# Patient Record
Sex: Female | Born: 1955 | Race: Black or African American | Hispanic: No | Marital: Married | Smoking: Never smoker
Health system: Southern US, Community
[De-identification: ages and names within clinical notes are randomized; demographics above are authoritative.]

## PROBLEM LIST (undated history)

## (undated) DIAGNOSIS — K449 Diaphragmatic hernia without obstruction or gangrene: Secondary | ICD-10-CM

## (undated) DIAGNOSIS — K219 Gastro-esophageal reflux disease without esophagitis: Secondary | ICD-10-CM

## (undated) HISTORY — DX: Gastro-esophageal reflux disease without esophagitis: K21.9

## (undated) HISTORY — DX: Diaphragmatic hernia without obstruction or gangrene: K44.9

---

## 2014-03-30 ENCOUNTER — Other Ambulatory Visit (HOSPITAL_COMMUNITY): Payer: Self-pay | Admitting: Family Medicine

## 2014-03-30 DIAGNOSIS — Z1231 Encounter for screening mammogram for malignant neoplasm of breast: Secondary | ICD-10-CM

## 2014-04-05 ENCOUNTER — Ambulatory Visit (HOSPITAL_COMMUNITY)
Admission: RE | Admit: 2014-04-05 | Discharge: 2014-04-05 | Disposition: A | Discharge status: Home / Self Care | Payer: BC Managed Care – PPO | Source: Ambulatory Visit | Attending: Family Medicine | Admitting: Family Medicine

## 2014-04-05 DIAGNOSIS — Z1231 Encounter for screening mammogram for malignant neoplasm of breast: Secondary | ICD-10-CM | POA: Insufficient documentation

## 2014-04-06 ENCOUNTER — Encounter: Payer: Self-pay | Admitting: *Deleted

## 2014-04-09 ENCOUNTER — Other Ambulatory Visit (HOSPITAL_COMMUNITY)
Admission: RE | Admit: 2014-04-09 | Discharge: 2014-04-09 | Disposition: A | Discharge status: Home / Self Care | Payer: BC Managed Care – PPO | Source: Ambulatory Visit | Attending: Obstetrics and Gynecology | Admitting: Obstetrics and Gynecology

## 2014-04-09 ENCOUNTER — Ambulatory Visit (INDEPENDENT_AMBULATORY_CARE_PROVIDER_SITE_OTHER): Payer: BC Managed Care – PPO | Admitting: Obstetrics and Gynecology

## 2014-04-09 ENCOUNTER — Encounter: Payer: Self-pay | Admitting: Obstetrics and Gynecology

## 2014-04-09 VITALS — BP 130/84 | Ht 60.0 in | Wt 200.4 lb

## 2014-04-09 DIAGNOSIS — R102 Pelvic and perineal pain: Secondary | ICD-10-CM | POA: Insufficient documentation

## 2014-04-09 DIAGNOSIS — N939 Abnormal uterine and vaginal bleeding, unspecified: Secondary | ICD-10-CM

## 2014-04-09 DIAGNOSIS — Z1212 Encounter for screening for malignant neoplasm of rectum: Secondary | ICD-10-CM

## 2014-04-09 DIAGNOSIS — Z1151 Encounter for screening for human papillomavirus (HPV): Secondary | ICD-10-CM | POA: Insufficient documentation

## 2014-04-09 DIAGNOSIS — Z01419 Encounter for gynecological examination (general) (routine) without abnormal findings: Secondary | ICD-10-CM | POA: Insufficient documentation

## 2014-04-09 DIAGNOSIS — Z113 Encounter for screening for infections with a predominantly sexual mode of transmission: Secondary | ICD-10-CM | POA: Insufficient documentation

## 2014-04-09 LAB — HEMOCCULT GUIAC POC 1CARD (OFFICE): Fecal Occult Blood, POC: NEGATIVE

## 2014-04-09 NOTE — Progress Notes (Signed)
This chart was scribed by Steva Colder, Medical Scribe, for Dr. Mallory Shirk on 04/09/14 at 9:13 AM. This chart was reviewed by Dr. Mallory Shirk for accuracy.     [facility] Visit  Patient name: Rachel Schaefer MRN [id number]  Date of birth: [birth year]  CC & HPI:  Rachel Schaefer is a 58 y.o. female presenting today for vaginal pain and vaginal spotting. She states that the spotting is not constant throughout the day. She states that she notices it 2-3 times a year and it "feels like a period". She states that the pain starts in her LLQ pain that radiates into her back. She states that the pain is improved with a bowel movement. She states that she is not having the pain as bad today. She states that she was seen at [facility] for the issue. She states that she did not hear back from [facility]. She states that she had a C-Section in the past. She states that she had fibroids in her uterus after the birth of her child. She states that she was regular in her periods.  ROS:  + Vaginal Pain + Vaginal Spotting +Constipation +History of Uterine Fibroids in years past  No other complaints.   Pertinent History Reviewed:   Reviewed: Significant for Medical         Past Medical History  Diagnosis Date  . Acid reflux   . Hernia, hiatal                               Surgical Hx:    Past Surgical History  Procedure Laterality Date  . Cesarean section     Medications: Reviewed & Updated - see associated section                      No current outpatient prescriptions on file.   Social History: Reviewed -  reports that she has never smoked. She has never used smokeless tobacco.  Objective Findings:  Vitals: Blood pressure 130/84, height 5' (1.524 m), weight 200 lb 6.4 oz (90.901 kg).   Physical Examination: General appearance - alert, well appearing, and in no distress, overweight and anxious  abd : nontender, well healed cesarean scar. Firm muscles, limiting  bimanual Pelvic - normal external genitalia, vulva, vagina, cervix, uterus and adnexa,  VULVA: normal appearing vulva with no masses, tenderness or lesions,  VAGINA: normal appearing vagina with normal color and discharge, no lesions,  CERVIX: normal appearing cervix without discharge or lesions,  UTERUS: uterus is enlarged to a 8 week size on exam limited by pt anatomy and anxiety, shape, consistency and nontender,  ADNEXA: normal adnexa in size, nontender and no masses Rectal - normal rectal, no masses     Assessment & Plan:   A:  1. Post-menopausal bleeding.  2. LLQ pain likely GI etiology.  3. H/o inadequate pap, 2015  P:  1. Pap repeated 2. Will schedule Ultrasound and endometrial biopsy.

## 2014-04-09 NOTE — Patient Instructions (Addendum)
Pap done, will follow up results and manage accordingly. Mammogram scheduled Routine preventative health maintenance measures emphasized  Follow up in 1 week, will schedule an Ultrasound and Endometrial biopsy.

## 2014-04-12 LAB — CYTOLOGY - PAP

## 2014-04-13 ENCOUNTER — Other Ambulatory Visit: Payer: Self-pay | Admitting: Obstetrics and Gynecology

## 2014-04-13 DIAGNOSIS — N950 Postmenopausal bleeding: Secondary | ICD-10-CM

## 2014-04-16 ENCOUNTER — Encounter: Payer: Self-pay | Admitting: Obstetrics and Gynecology

## 2014-04-16 ENCOUNTER — Ambulatory Visit (INDEPENDENT_AMBULATORY_CARE_PROVIDER_SITE_OTHER): Payer: BC Managed Care – PPO

## 2014-04-16 ENCOUNTER — Ambulatory Visit (INDEPENDENT_AMBULATORY_CARE_PROVIDER_SITE_OTHER): Payer: BC Managed Care – PPO | Admitting: Obstetrics and Gynecology

## 2014-04-16 ENCOUNTER — Other Ambulatory Visit: Payer: Self-pay | Admitting: Obstetrics and Gynecology

## 2014-04-16 VITALS — BP 150/98 | Ht 60.0 in | Wt 198.2 lb

## 2014-04-16 DIAGNOSIS — N840 Polyp of corpus uteri: Secondary | ICD-10-CM

## 2014-04-16 DIAGNOSIS — Z3202 Encounter for pregnancy test, result negative: Secondary | ICD-10-CM

## 2014-04-16 DIAGNOSIS — N939 Abnormal uterine and vaginal bleeding, unspecified: Secondary | ICD-10-CM

## 2014-04-16 DIAGNOSIS — N950 Postmenopausal bleeding: Secondary | ICD-10-CM

## 2014-04-16 LAB — POCT URINE PREGNANCY: PREG TEST UR: NEGATIVE

## 2014-04-16 NOTE — Patient Instructions (Signed)
Endometrial Biopsy Endometrial biopsy is a procedure in which a tissue sample is taken from inside the uterus. The tissue sample is then looked at under a microscope to see if the tissue is normal or abnormal. The endometrium is the lining of the uterus. This procedure helps determine where you are in your menstrual cycle and how hormone levels are affecting the lining of the uterus. This procedure may also be used to evaluate uterine bleeding or to diagnose endometrial cancer, tuberculosis, polyps, or inflammatory conditions.  LET YOUR HEALTH CARE PROVIDER KNOW ABOUT:  Any allergies you have.  All medicines you are taking, including vitamins, herbs, eye drops, creams, and over-the-counter medicines.  Previous problems you or members of your family have had with the use of anesthetics.  Any blood disorders you have.  Previous surgeries you have had.  Medical conditions you have.  Possibility of pregnancy. RISKS AND COMPLICATIONS Generally, this is a safe procedure. However, as with any procedure, complications can occur. Possible complications include:  Bleeding.  Pelvic infection.  Puncture of the uterine wall with the biopsy device (rare). BEFORE THE PROCEDURE   Keep a record of your menstrual cycles as directed by your health care provider. You may need to schedule your procedure for a specific time in your cycle.  You may want to bring a sanitary pad to wear home after the procedure.  Arrange for someone to drive you home after the procedure if you will be given a medicine to help you relax (sedative). PROCEDURE   You may be given a sedative to relax you.  You will lie on an exam table with your feet and legs supported as in a pelvic exam.  Your health care provider will insert an instrument (speculum) into your vagina to see your cervix.  Your cervix will be cleansed with an antiseptic solution. A medicine (local anesthetic) will be used to numb the cervix.  A forceps  instrument (tenaculum) will be used to hold your cervix steady for the biopsy.  A thin, rodlike instrument (uterine sound) will be inserted through your cervix to determine the length of your uterus and the location where the biopsy sample will be removed.  A thin, flexible tube (catheter) will be inserted through your cervix and into the uterus. The catheter is used to collect the biopsy sample from your endometrial tissue.  The catheter and speculum will then be removed, and the tissue sample will be sent to a lab for examination. AFTER THE PROCEDURE  You will rest in a recovery area until you are ready to go home.  You may have mild cramping and a small amount of vaginal bleeding for a few days after the procedure. This is normal.  Make sure you find out how to get your test results. Document Released: 01/04/2005 Document Revised: 05/06/2013 Document Reviewed: 02/18/2013 Exit[employer] Patient Information 2015 [employer]. This information is not intended to replace advice given to you by your health care provider. Make sure you discuss any questions you have with your health care provider.  

## 2014-04-16 NOTE — Progress Notes (Signed)
Patient ID: Azlin Zilberman, female   DOB: 07-10-1956, 58 y.o.   MRN: [id number]   [facility] Visit  Patient name: Nohea Kras MRN [id number]  Date of birth: 03-Jul-1956  CC & HPI:  Macy Polio is a 58 y.o. female presenting today for u/s and endo bx  ROS:    Pertinent History Reviewed:   Reviewed: Significant for see prior visit Medical         Past Medical History  Diagnosis Date  . Acid reflux   . Hernia, hiatal                               Surgical Hx:    Past Surgical History  Procedure Laterality Date  . Cesarean section     Medications: Reviewed & Updated - see associated section                      Current outpatient prescriptions:Omeprazole (PRILOSEC PO), Take by mouth daily., Disp: , Rfl:    Social History: Reviewed -  reports that she has never smoked. She has never used smokeless tobacco.  Objective Findings:  Vitals: Blood pressure 150/98, height 5' (1.524 m), weight 198 lb 3.2 oz (89.903 kg).  Rhyann Berton is a 58 y.o. G1P1001 for a pelvic sonogram for post menopausal bleeding. H/O fibroids  Uterus 8.1 x 6.2 x 4.6 cm, anteverted with multiple fibroids noted (4 in # largest 2= 3.4 & 2.8cm)  Endometrium 17.2 mm, asymmetrical, with 17 x 68mm hyperechoic mass noted within the fundal region of the cavity with +Doppler flow noted  Right ovary 2.0 x 1.6 x 1.4 cm,  Left ovary 1.4 x 1.1 x 1.1 cm,  No free fluid or adnexal masses noted wtihin the pelvis  Technician Comments:  Anteverted uterus with multiple fibroids noted, Asymmetrical endometrium with 17 x 29mm hyperechoic mass noted, bilateral ovaries appear WNL, no free fluid or adnexal masses noted within the pelvis  Lazarus Gowda  04/16/2014  9:35 AM  Endometrial Biopsy: Patient given informed consent, signed copy in the chart, time out was performed. Time out taken. The patient was placed in the lithotomy position and the cervix brought into view with sterile speculum.  Portio of cervix cleansed x 2  with betadine swabs.  A tenaculum was placed in the anterior lip of the cervix. The uterus was sounded for depth of 8 cm,. Milex uterine Explora 3 mm was introduced to into the uterus, suction created,  and an endometrial sample was obtained. All equipment was removed and accounted for.   The patient tolerated the procedure well.  Scanty tissue obtained. Difficulty of Cervical stenosis resolved by serial dilation.   Patient given post procedure instructions.  Followup: by phnone   Assessment & Plan:   A:  1. PMB  cervical stenosis   Ut fibroids   Suspected endometrial polyp   P:  1. F/u results by phone

## 2014-04-20 ENCOUNTER — Telehealth: Payer: Self-pay | Admitting: Obstetrics and Gynecology

## 2014-04-20 NOTE — Telephone Encounter (Signed)
Patient informed of the Pathology biopsy, BENIGN ENDOMETRIAL POLYP.Marland Kitchen Hysteroscopy to remove the endometrial polyp advised. I wll ask Amy Drema Dallas to contact pt to schedule hysteroscopy, Dilation and Curettage, with removal of endometrial polyp.

## 2014-05-07 ENCOUNTER — Telehealth: Payer: Self-pay | Admitting: Obstetrics and Gynecology

## 2014-05-19 ENCOUNTER — Encounter: Payer: Self-pay | Admitting: Obstetrics and Gynecology

## 2014-05-19 ENCOUNTER — Ambulatory Visit (INDEPENDENT_AMBULATORY_CARE_PROVIDER_SITE_OTHER): Payer: BC Managed Care – PPO | Admitting: Obstetrics and Gynecology

## 2014-05-19 VITALS — BP 120/86 | Ht 60.0 in | Wt 200.0 lb

## 2014-05-19 DIAGNOSIS — N840 Polyp of corpus uteri: Secondary | ICD-10-CM

## 2014-05-19 NOTE — Progress Notes (Signed)
   [facility] Visit  Patient name: Rachel Schaefer MRN [id number]  Date of birth: [birth year]  CC & HPI:  Rachel Schaefer is a 58 y.o. female presenting today for discussion and to schedule surgery. She states that she is still having the daily pain on the left side noted upon awakening and improves with activity. She states that she notices it during the course of the day, if she sits too long and then gets up. She states that she is having some pain today.  ROS:  +Endometrium Polyp No other complaints  Pertinent History Reviewed:   Reviewed: Significant for  Medical         Past Medical History  Diagnosis Date  . Acid reflux   . Hernia, hiatal                               Surgical Hx:    Past Surgical History  Procedure Laterality Date  . Cesarean section     Medications: Reviewed & Updated - see associated section                      Current outpatient prescriptions:Omeprazole (PRILOSEC PO), Take by mouth daily., Disp: , Rfl:    Social History: Reviewed -  reports that she has never smoked. She has never used smokeless tobacco.  Objective Findings:  Vitals: Blood pressure 120/86, height 5' (1.524 m), weight 200 lb (90.719 kg).  Physical Examination: Pelvic - normal external genitalia, vulva, vagina, cervix, uterus and adnexa,  VULVA: normal appearing vulva with no masses, tenderness or lesions,  VAGINA: normal appearing vagina with normal color and discharge, no lesions, good support,  CERVIX: normal appearing cervix without discharge or lesions,  UTERUS: uterus is normal size, shape, consistency and nontender,  ADNEXA: normal adnexa in size, nontender and no masses   Assessment & Plan:   A:  1. Endometrium Polyp noted. 2. Physical Exam shows to not be the cause for the chronic pain that the pt is having.   P:  1. Methods discussed about removal of Polyp or to F/U every 6 months for Review of Symptoms.   Over 25 minutes spent counseling and discussing  various methods for Endometrial polyp.   This chart was scribed by Steva Colder, Medical Scribe, for Dr. Mallory Shirk on 05/19/14 at 11:38 AM. This chart was reviewed by Dr. Mallory Shirk for accuracy.

## 2014-06-10 NOTE — Telephone Encounter (Signed)
Called patient but unable to leave a message. 

## 2014-06-22 ENCOUNTER — Telehealth: Payer: Self-pay | Admitting: *Deleted

## 2014-06-22 NOTE — Telephone Encounter (Signed)
Error.  Wasn't a nurse call.

## 2014-07-20 ENCOUNTER — Encounter: Payer: Self-pay | Admitting: Obstetrics and Gynecology

## 2014-11-15 ENCOUNTER — Encounter: Payer: Self-pay | Admitting: Obstetrics and Gynecology

## 2014-11-15 ENCOUNTER — Ambulatory Visit (INDEPENDENT_AMBULATORY_CARE_PROVIDER_SITE_OTHER): Payer: BC Managed Care – PPO | Admitting: Obstetrics and Gynecology

## 2014-11-15 VITALS — BP 120/80 | HR 80 | Ht 60.0 in | Wt 195.0 lb

## 2014-11-15 DIAGNOSIS — N840 Polyp of corpus uteri: Secondary | ICD-10-CM

## 2014-11-15 NOTE — Progress Notes (Signed)
Patient ID: Rachel Schaefer, female   DOB: 1956-04-13, 59 y.o.   MRN: [id number] Pt here today for a six month follow up. Pt states that things are the same with no change. Pt states that she has not seen any spotting at all. Pt states that she may have the pain every now and then but not as often as she was having it before.

## 2014-11-15 NOTE — Progress Notes (Addendum)
Patient ID: Rachel Schaefer, female   DOB: 05-Sep-1956, 59 y.o.   MRN: [id number]    [facility] Visit  Patient name: Rachel Schaefer MRN [id number]  Date of birth: December 03, 1955  CC & HPI:  [name] is a 59 y.o. female presenting today for 6 month follow-up of endometrial polyp. [place] in July 2015 showed asymmetrical endometrium with a 17 x 14 mm hyperechoic mass. Endometrial biopsy showed benign mass. She noted intermittent abdominal pain associated with the mass in the past, none now. Pt denies vaginal bleeding. We have discussed removing the polyp and pt agrees with removal. Will schedule for end of march.(spring Break)   ROS:  A complete 10 system review of systems was obtained and all systems are negative except as noted in the HPI and PMH.   Pertinent History Reviewed:   Reviewed. Medical         Past Medical History  Diagnosis Date   Acid reflux    Hernia, hiatal                               Surgical Hx:    Past Surgical History  Procedure Laterality Date   Cesarean section     Medications: Reviewed & Updated - see associated section                       Current outpatient prescriptions:    Omeprazole (PRILOSEC PO), Take by mouth daily., Disp: , Rfl:    Social History: Reviewed -  reports that she has never smoked. She has never used smokeless tobacco.  Objective Findings:  Vitals: Blood pressure 120/80, pulse 80, height 5' (1.524 m), weight 195 lb (88.451 kg).  Physical Examination: General appearance - alert, well appearing, and in no distress and oriented to person, place, and time Mental status - alert, oriented to person, place, and time HENT: PERRL; oropharynx clear and moist, uvula midline Pelvic - normal external genitalia, vulva, vagina, cervix, uterus and adnexa  VULVA: normal appearing vulva with no masses, tenderness or lesions VAGINA: normal appearing vagina with normal color and discharge, no lesions  CERVIX: normal appearing cervix without  discharge or lesions  UTERUS: uterus is normal size, shape, consistency and nontender  ADNEXA: normal adnexa in size, nontender and no masses   Assessment & Plan:   A:  1. Benign endometrial polyp measuring 17 x 14 mm 2. Discussed removal of polyp  P:  1.  Polypectomy in 3 weeks Day surgery [facility]    This chart was scribed for [name], MD by Tula Nakayama, ED Scribe. This patient was seen in room 1 and the patient's care was started at 10:17 AM.   I personally performed the services described in this documentation, which was SCRIBED in my presence. The recorded information has been reviewed and considered accurate. It has been edited as necessary during review. [name], MD

## 2014-12-08 ENCOUNTER — Other Ambulatory Visit: Payer: Self-pay | Admitting: Obstetrics and Gynecology

## 2014-12-08 NOTE — H&P (Signed)
  Progress Notes    Expand All Collapse All   Patient ID: Jaquanda Schaefer, female DOB: 09-02-1956, 59 y.o. MRN: [id number]   [facility] Visit  Patient name: Rachel Schaefer [id number] Date of birth: 1956/08/07  CC & HPI:  Ardyn Forge is a 59 y.o. female presenting today for 6 month follow-up of endometrial polyp. [place] in July 2015 showed asymmetrical endometrium with a 17 x 14 mm hyperechoic mass. Endometrial biopsy showed benign mass. She noted intermittent abdominal pain associated with the mass in the past, none now. Pt denies vaginal bleeding. We have discussed removing the polyp and pt agrees with removal. Will schedule for end of march.(spring Break)  ROS:  A complete 10 system review of systems was obtained and all systems are negative except as noted in the HPI and PMH.   Pertinent History Reviewed:  Reviewed. Medical  Past Medical History  Diagnosis Date  . Acid reflux   . Hernia, hiatal     Surgical Hx:  Past Surgical History  Procedure Laterality Date  . Cesarean section     Medications: Reviewed & Updated - see associated section   Current outpatient prescriptions:  . Omeprazole (PRILOSEC PO), Take by mouth daily., Disp: , Rfl:    Social History: Reviewed -  reports that she has never smoked. She has never used smokeless tobacco.  Objective Findings:  Vitals: Blood pressure 120/80, pulse 80, height 5' (1.524 m), weight 195 lb (88.451 kg).  Physical Examination: General appearance - alert, well appearing, and in no distress and oriented to person, place, and time Mental status - alert, oriented to person, place, and time HENT: PERRL; oropharynx clear and moist, uvula midline Pelvic - normal external genitalia, vulva, vagina, cervix, uterus and adnexa  VULVA: normal appearing vulva with no masses, tenderness or lesions VAGINA: normal appearing  vagina with normal color and discharge, no lesions  CERVIX: normal appearing cervix without discharge or lesions  UTERUS: uterus is normal size, shape, consistency and nontender  ADNEXA: normal adnexa in size, nontender and no masses   Assessment & Plan:   A:  1. Benign endometrial polyp measuring 17 x 14 mm 2. Discussed removal of polyp  P:  1.Hysteroscopy, Dilation and Curettage, removal of endometrial polyp, Day surgery [facility] Scheduled for 14 December 2014

## 2014-12-08 NOTE — Patient Instructions (Signed)
Rachel Schaefer  12/08/2014   Your procedure is scheduled on:   12/14/2014  Report to [facility] at  850  AM.  Call this number if you have problems the morning of surgery: [phone]   Remember:   Do not eat food or drink liquids after midnight.   Take these medicines the morning of surgery with A SIP OF WATER:  prilosec   Do not wear jewelry, make-up or nail polish.  Do not wear lotions, powders, or perfumes.   Do not shave 48 hours prior to surgery. Men may shave face and neck.  Do not bring valuables to the hospital.  [facility] is not responsible for any belongings or valuables.               Contacts, dentures or bridgework may not be worn into surgery.  Leave suitcase in the car. After surgery it may be brought to your room.  For patients admitted to the hospital, discharge time is determined by your treatment team.               Patients discharged the day of surgery will not be allowed to drive home.  Name and phone number of your driver: family  Special Instructions: Shower using CHG 2 nights before surgery and the night before surgery.  If you shower the day of surgery use CHG.  Use special wash - you have one bottle of CHG for all showers.  You should use approximately 1/3 of the bottle for each shower.   Please read over the following fact sheets that you were given: Pain Booklet, Coughing and Deep Breathing, Surgical Site Infection Prevention, Anesthesia Post-op Instructions and Care and Recovery After Surgery Hysteroscopy Hysteroscopy is a procedure used for looking inside the womb (uterus). It may be done for various reasons, including:  To evaluate abnormal bleeding, fibroid (benign, noncancerous) tumors, polyps, scar tissue (adhesions), and possibly cancer of the uterus.  To look for lumps (tumors) and other uterine growths.  To look for causes of why a woman cannot get pregnant (infertility), causes of recurrent loss of pregnancy (miscarriages), or a  lost intrauterine device (IUD).  To perform a sterilization by blocking the fallopian tubes from inside the uterus. In this procedure, a thin, flexible tube with a tiny light and camera on the end of it (hysteroscope) is used to look inside the uterus. A hysteroscopy should be done right after a menstrual period to be sure you are not pregnant. LET [facility] CARE PROVIDER KNOW ABOUT:   Any allergies you have.  All medicines you are taking, including vitamins, herbs, eye drops, creams, and over-the-counter medicines.  Previous problems you or members of your family have had with the use of anesthetics.  Any blood disorders you have.  Previous surgeries you have had.  Medical conditions you have. RISKS AND COMPLICATIONS  Generally, this is a safe procedure. However, as with any procedure, complications can occur. Possible complications include:  Putting a hole in the uterus.  Excessive bleeding.  Infection.  Damage to the cervix.  Injury to other organs.  Allergic reaction to medicines.  Too much fluid used in the uterus for the procedure. BEFORE THE PROCEDURE   Ask your health care provider about changing or stopping any regular medicines.  Do not take aspirin or blood thinners for 1 week before the procedure, or as directed by your health care provider. These can cause bleeding.  If you smoke, do  not smoke for 2 weeks before the procedure.  In some cases, a medicine is placed in the cervix the day before the procedure. This medicine makes the cervix have a larger opening (dilate). This makes it easier for the instrument to be inserted into the uterus during the procedure.  Do not eat or drink anything for at least 8 hours before the surgery.  Arrange for someone to take you home after the procedure. PROCEDURE   You may be given a medicine to relax you (sedative). You may also be given one of the following:  A medicine that numbs the area around the cervix (local  anesthetic).  A medicine that makes you sleep through the procedure (general anesthetic).  The hysteroscope is inserted through the vagina into the uterus. The camera on the hysteroscope sends a picture to a TV screen. This gives the surgeon a good view inside the uterus.  During the procedure, air or a liquid is put into the uterus, which allows the surgeon to see better.  Sometimes, tissue is gently scraped from inside the uterus. These tissue samples are sent to a lab for testing. AFTER THE PROCEDURE   If you had a general anesthetic, you may be groggy for a couple hours after the procedure.  If you had a local anesthetic, you will be able to go home as soon as you are stable and feel ready.  You may have some cramping. This normally lasts for a couple days.  You may have bleeding, which varies from light spotting for a few days to menstrual-like bleeding for 3-7 days. This is normal.  If your test results are not back during the visit, make an appointment with your health care provider to find out the results. Document Released: 12/10/2000 Document Revised: 06/24/2013 Document Reviewed: 04/02/2013 [facility] Patient Information 2015 [place], [place]. This information is not intended to replace advice given to you by your health care provider. Make sure you discuss any questions you have with your health care provider. Dilation and Curettage or Vacuum Curettage Dilation and curettage (D&C) and vacuum curettage are minor procedures. A D&C involves stretching (dilation) the cervix and scraping (curettage) the inside lining of the womb (uterus). During a D&C, tissue is gently scraped from the inside lining of the uterus. During a vacuum curettage, the lining and tissue in the uterus are removed with the use of gentle suction.  Curettage may be performed to either diagnose or treat a problem. As a diagnostic procedure, curettage is performed to examine tissues from the uterus. A diagnostic  curettage may be performed for the following symptoms:   Irregular bleeding in the uterus.   Bleeding with the development of clots.   Spotting between menstrual periods.   Prolonged menstrual periods.   Bleeding after menopause.   No menstrual period (amenorrhea).   A change in size and shape of the uterus.  As a treatment procedure, curettage may be performed for the following reasons:   Removal of an IUD (intrauterine device).   Removal of retained placenta after giving birth. Retained placenta can cause an infection or bleeding severe enough to require transfusions.   Abortion.   Miscarriage.   Removal of polyps inside the uterus.   Removal of uncommon types of noncancerous lumps (fibroids).  LET [facility] CARE PROVIDER KNOW ABOUT:   Any allergies you have.   All medicines you are taking, including vitamins, herbs, eye drops, creams, and over-the-counter medicines.   Previous problems you or members of  your family have had with the use of anesthetics.   Any blood disorders you have.   Previous surgeries you have had.   Medical conditions you have. RISKS AND COMPLICATIONS  Generally, this is a safe procedure. However, as with any procedure, complications can occur. Possible complications include:  Excessive bleeding.   Infection of the uterus.   Damage to the cervix.   Development of scar tissue (adhesions) inside the uterus, later causing abnormal amounts of menstrual bleeding.   Complications from the general anesthetic, if a general anesthetic is used.   Putting a hole (perforation) in the uterus. This is rare.  BEFORE THE PROCEDURE   Eat and drink before the procedure only as directed by your health care provider.   Arrange for someone to take you home.  PROCEDURE  This procedure usually takes about 15-30 minutes.  You will be given one of the following:  A medicine that numbs the area in and around the cervix (local  anesthetic).   A medicine to make you sleep through the procedure (general anesthetic).  You will lie on your back with your legs in stirrups.   A warm metal or plastic instrument (speculum) will be placed in your vagina to keep it open and to allow the health care provider to see the cervix.  There are two ways in which your cervix can be softened and dilated. These include:   Taking a medicine.   Having thin rods (laminaria) inserted into your cervix.   A curved tool (curette) will be used to scrape cells from the inside lining of the uterus. In some cases, gentle suction is applied with the curette. The curette will then be removed.  AFTER THE PROCEDURE   You will rest in the recovery area until you are stable and are ready to go home.   You may feel sick to your stomach (nauseous) or throw up (vomit) if you were given a general anesthetic.   You may have a sore throat if a tube was placed in your throat during general anesthesia.   You may have light cramping and bleeding. This may last for 2 days to 2 weeks after the procedure.   Your uterus needs to make a new lining after the procedure. This may make your next period late. Document Released: 09/03/2005 Document Revised: 05/06/2013 Document Reviewed: 04/02/2013 [facility] Patient Information 2015 [place], [place]. This information is not intended to replace advice given to you by your health care provider. Make sure you discuss any questions you have with your health care provider. PATIENT INSTRUCTIONS POST-ANESTHESIA  IMMEDIATELY FOLLOWING SURGERY:  Do not drive or operate machinery for the first twenty four hours after surgery.  Do not make any important decisions for twenty four hours after surgery or while taking narcotic pain medications or sedatives.  If you develop intractable nausea and vomiting or a severe headache please notify your doctor immediately.  FOLLOW-UP:  Please make an appointment with your surgeon  as instructed. You do not need to follow up with anesthesia unless specifically instructed to do so.  WOUND CARE INSTRUCTIONS (if applicable):  Keep a dry clean dressing on the anesthesia/puncture wound site if there is drainage.  Once the wound has quit draining you may leave it open to air.  Generally you should leave the bandage intact for twenty four hours unless there is drainage.  If the epidural site drains for more than 36-48 hours please call the anesthesia department.  QUESTIONS?:  Please feel free  to call your physician or the hospital operator if you have any questions, and they will be happy to assist you.

## 2014-12-13 ENCOUNTER — Encounter (HOSPITAL_COMMUNITY)
Admission: RE | Admit: 2014-12-13 | Discharge: 2014-12-13 | Disposition: A | Discharge status: Home / Self Care | Payer: BC Managed Care – PPO | Source: Ambulatory Visit | Attending: Obstetrics and Gynecology | Admitting: Obstetrics and Gynecology

## 2014-12-13 ENCOUNTER — Other Ambulatory Visit: Payer: Self-pay

## 2014-12-13 ENCOUNTER — Encounter (HOSPITAL_COMMUNITY): Payer: Self-pay

## 2014-12-13 DIAGNOSIS — K219 Gastro-esophageal reflux disease without esophagitis: Secondary | ICD-10-CM | POA: Diagnosis not present

## 2014-12-13 DIAGNOSIS — N882 Stricture and stenosis of cervix uteri: Secondary | ICD-10-CM | POA: Diagnosis not present

## 2014-12-13 DIAGNOSIS — D259 Leiomyoma of uterus, unspecified: Secondary | ICD-10-CM | POA: Diagnosis not present

## 2014-12-13 DIAGNOSIS — N950 Postmenopausal bleeding: Secondary | ICD-10-CM | POA: Diagnosis not present

## 2014-12-13 DIAGNOSIS — N840 Polyp of corpus uteri: Secondary | ICD-10-CM | POA: Diagnosis not present

## 2014-12-13 LAB — URINALYSIS, ROUTINE W REFLEX MICROSCOPIC
Bilirubin Urine: NEGATIVE
GLUCOSE, UA: NEGATIVE mg/dL
Ketones, ur: NEGATIVE mg/dL
LEUKOCYTES UA: NEGATIVE
Nitrite: NEGATIVE
PH: 6 (ref 5.0–8.0)
Protein, ur: NEGATIVE mg/dL
Specific Gravity, Urine: 1.015 (ref 1.005–1.030)
Urobilinogen, UA: 0.2 mg/dL (ref 0.0–1.0)

## 2014-12-13 LAB — BASIC METABOLIC PANEL
ANION GAP: 9 (ref 5–15)
BUN: 8 mg/dL (ref 6–23)
CHLORIDE: 107 mmol/L (ref 96–112)
CO2: 25 mmol/L (ref 19–32)
Calcium: 9.1 mg/dL (ref 8.4–10.5)
Creatinine, Ser: 1.1 mg/dL (ref 0.50–1.10)
GFR, EST AFRICAN AMERICAN: 62 mL/min — AB (ref >90)
GFR, EST NON AFRICAN AMERICAN: 54 mL/min — AB (ref >90)
Glucose, Bld: 80 mg/dL (ref 70–99)
POTASSIUM: 4.2 mmol/L (ref 3.5–5.1)
SODIUM: 141 mmol/L (ref 135–145)

## 2014-12-13 LAB — URINE MICROSCOPIC-ADD ON

## 2014-12-13 LAB — CBC
HEMATOCRIT: 43.6 % (ref 36.0–46.0)
HEMOGLOBIN: 13.8 g/dL (ref 12.0–15.0)
MCH: 26.6 pg (ref 26.0–34.0)
MCHC: 31.7 g/dL (ref 30.0–36.0)
MCV: 84.2 fL (ref 78.0–100.0)
Platelets: 246 10*3/uL (ref 150–400)
RBC: 5.18 MIL/uL — ABNORMAL HIGH (ref 3.87–5.11)
RDW: 15 % (ref 11.5–15.5)
WBC: 4.5 10*3/uL (ref 4.0–10.5)

## 2014-12-13 NOTE — H&P (Signed)
    Progress Notes    Expand All Collapse All   Patient ID: Rachel Schaefer, female DOB: 16-Jun-1956, 59 y.o. MRN: [id number]  [place] Visit  Patient name: Rachel Schaefer [id number] Date of birth: 05/19/1956  CC & HPI:  Rachel Schaefer is a 59 y.o. female presenting today for u/s and endo bx  ROS:    Pertinent History Reviewed:  Reviewed: Significant for see prior visit Medical  Past Medical History  Diagnosis Date  . Acid reflux   . Hernia, hiatal     Surgical Hx:  Past Surgical History  Procedure Laterality Date  . Cesarean section     Medications: Reviewed & Updated - see associated section  Current outpatient prescriptions:Omeprazole (PRILOSEC PO), Take by mouth daily., Disp: , Rfl:    Social History: Reviewed -  reports that she has never smoked. She has never used smokeless tobacco.  Objective Findings:  Vitals: Blood pressure 150/98, height 5' (1.524 m), weight 198 lb 3.2 oz (89.903 kg).  Rachel Schaefer is a 59 y.o. G1P1001 for a pelvic sonogram for post menopausal bleeding. H/O fibroids  Uterus 8.1 x 6.2 x 4.6 cm, anteverted with multiple fibroids noted (4 in # largest 2= 3.4 & 2.8cm)  Endometrium 17.2 mm, asymmetrical, with 17 x 6mm hyperechoic mass noted within the fundal region of the cavity with +Doppler flow noted  Right ovary 2.0 x 1.6 x 1.4 cm,  Left ovary 1.4 x 1.1 x 1.1 cm,  No free fluid or adnexal masses noted wtihin the pelvis  Technician Comments:  Anteverted uterus with multiple fibroids noted, Asymmetrical endometrium with 17 x 1mm hyperechoic mass noted, bilateral ovaries appear WNL, no free fluid or adnexal masses noted within the pelvis  Lazarus Gowda  04/16/2014  9:35 AM  Endometrial Biopsy: Patient given informed consent, signed copy in the chart, time out was performed. Time out taken. The patient was placed in the  lithotomy position and the cervix brought into view with sterile speculum. Portio of cervix cleansed x 2 with betadine swabs. A tenaculum was placed in the anterior lip of the cervix. The uterus was sounded for depth of 8 cm,. Milex uterine Explora 3 mm was introduced to into the uterus, suction created, and an endometrial sample was obtained. All equipment was removed and accounted for.  The patient tolerated the procedure well. Scanty tissue obtained. Difficulty of Cervical stenosis resolved by serial dilation.  Patient given post procedure instructions.  Followup: by phnone   Assessment & Plan:   A:  1. PMB cervical stenosis  Ut fibroids  Suspected endometrial polyp  Plan: hysteroscopy dilation and curettage, removal of endometrial polyp

## 2014-12-14 ENCOUNTER — Encounter (HOSPITAL_COMMUNITY): Payer: Self-pay | Admitting: *Deleted

## 2014-12-14 ENCOUNTER — Ambulatory Visit (HOSPITAL_COMMUNITY)
Admission: RE | Admit: 2014-12-14 | Discharge: 2014-12-14 | Disposition: A | Discharge status: Home / Self Care | Payer: BC Managed Care – PPO | Source: Ambulatory Visit | Attending: Obstetrics and Gynecology | Admitting: Obstetrics and Gynecology

## 2014-12-14 ENCOUNTER — Ambulatory Visit (HOSPITAL_COMMUNITY): Payer: BC Managed Care – PPO | Admitting: Anesthesiology

## 2014-12-14 ENCOUNTER — Encounter (HOSPITAL_COMMUNITY)
Admission: RE | Disposition: A | Discharge status: Home / Self Care | Payer: Self-pay | Source: Ambulatory Visit | Attending: Obstetrics and Gynecology

## 2014-12-14 DIAGNOSIS — D259 Leiomyoma of uterus, unspecified: Secondary | ICD-10-CM | POA: Insufficient documentation

## 2014-12-14 DIAGNOSIS — N882 Stricture and stenosis of cervix uteri: Secondary | ICD-10-CM | POA: Insufficient documentation

## 2014-12-14 DIAGNOSIS — K219 Gastro-esophageal reflux disease without esophagitis: Secondary | ICD-10-CM | POA: Insufficient documentation

## 2014-12-14 DIAGNOSIS — N840 Polyp of corpus uteri: Secondary | ICD-10-CM | POA: Diagnosis not present

## 2014-12-14 DIAGNOSIS — N950 Postmenopausal bleeding: Secondary | ICD-10-CM | POA: Insufficient documentation

## 2014-12-14 HISTORY — PX: POLYPECTOMY: SHX5525

## 2014-12-14 HISTORY — PX: HYSTEROSCOPY WITH D & C: SHX1775

## 2014-12-14 SURGERY — DILATATION AND CURETTAGE /HYSTEROSCOPY
Anesthesia: General

## 2014-12-14 MED ORDER — FENTANYL CITRATE 0.05 MG/ML IJ SOLN: 25.0000 ug | INTRAMUSCULAR | Status: DC | PRN

## 2014-12-14 MED ORDER — NEOSTIGMINE METHYLSULFATE 10 MG/10ML IV SOLN
INTRAVENOUS | Status: DC | PRN
  Administered 2014-12-14: 1 mg via INTRAVENOUS
  Administered 2014-12-14: 2 mg via INTRAVENOUS

## 2014-12-14 MED ORDER — ONDANSETRON HCL 4 MG/2ML IJ SOLN
4.0000 mg | Freq: Once | INTRAMUSCULAR | Status: AC
  Administered 2014-12-14: 4 mg via INTRAVENOUS

## 2014-12-14 MED ORDER — PROPOFOL 10 MG/ML IV BOLUS
INTRAVENOUS | Status: DC | PRN
  Administered 2014-12-14: 140 mg via INTRAVENOUS

## 2014-12-14 MED ORDER — LIDOCAINE HCL (PF) 1 % IJ SOLN
INTRAMUSCULAR | Status: AC
  Filled 2014-12-14: qty 5

## 2014-12-14 MED ORDER — SODIUM CHLORIDE 0.9 % IR SOLN
Status: DC | PRN
  Administered 2014-12-14: 1000 mL

## 2014-12-14 MED ORDER — LIDOCAINE HCL 1 % IJ SOLN
INTRAMUSCULAR | Status: DC | PRN
  Administered 2014-12-14: 25 mg via INTRADERMAL

## 2014-12-14 MED ORDER — GLYCOPYRROLATE 0.2 MG/ML IJ SOLN
INTRAMUSCULAR | Status: DC | PRN
  Administered 2014-12-14: 0.4 mg via INTRAVENOUS

## 2014-12-14 MED ORDER — MIDAZOLAM HCL 2 MG/2ML IJ SOLN
INTRAMUSCULAR | Status: AC
  Filled 2014-12-14: qty 2

## 2014-12-14 MED ORDER — GLYCOPYRROLATE 0.2 MG/ML IJ SOLN
INTRAMUSCULAR | Status: AC
  Filled 2014-12-14: qty 2

## 2014-12-14 MED ORDER — ROCURONIUM BROMIDE 50 MG/5ML IV SOLN
INTRAVENOUS | Status: AC
  Filled 2014-12-14: qty 1

## 2014-12-14 MED ORDER — ONDANSETRON HCL 4 MG/2ML IJ SOLN
INTRAMUSCULAR | Status: AC
  Filled 2014-12-14: qty 2

## 2014-12-14 MED ORDER — ROCURONIUM BROMIDE 100 MG/10ML IV SOLN
INTRAVENOUS | Status: DC | PRN
  Administered 2014-12-14: 35 mg via INTRAVENOUS

## 2014-12-14 MED ORDER — MIDAZOLAM HCL 2 MG/2ML IJ SOLN
1.0000 mg | INTRAMUSCULAR | Status: DC | PRN
  Administered 2014-12-14: 2 mg via INTRAVENOUS

## 2014-12-14 MED ORDER — MIDAZOLAM HCL 5 MG/5ML IJ SOLN
INTRAMUSCULAR | Status: DC | PRN
  Administered 2014-12-14: 2 mg via INTRAVENOUS

## 2014-12-14 MED ORDER — ONDANSETRON HCL 4 MG/2ML IJ SOLN: 4.0000 mg | Freq: Once | INTRAMUSCULAR | Status: DC | PRN

## 2014-12-14 MED ORDER — FENTANYL CITRATE 0.05 MG/ML IJ SOLN
INTRAMUSCULAR | Status: DC | PRN
Start: 2014-12-14 — End: 2014-12-14
  Administered 2014-12-14: 25 ug via INTRAVENOUS
  Administered 2014-12-14: 50 ug via INTRAVENOUS
  Administered 2014-12-14: 25 ug via INTRAVENOUS

## 2014-12-14 MED ORDER — FERRIC SUBSULFATE 259 MG/GM EX SOLN
CUTANEOUS | Status: AC
  Filled 2014-12-14: qty 8

## 2014-12-14 MED ORDER — LACTATED RINGERS IV SOLN
INTRAVENOUS | Status: DC
  Administered 2014-12-14: 10:00:00 via INTRAVENOUS

## 2014-12-14 MED ORDER — NEOSTIGMINE METHYLSULFATE 10 MG/10ML IV SOLN
INTRAVENOUS | Status: AC
  Filled 2014-12-14: qty 2

## 2014-12-14 MED ORDER — FENTANYL CITRATE 0.05 MG/ML IJ SOLN
INTRAMUSCULAR | Status: AC
  Filled 2014-12-14: qty 2

## 2014-12-14 MED ORDER — PROPOFOL 10 MG/ML IV BOLUS
INTRAVENOUS | Status: AC
  Filled 2014-12-14: qty 20

## 2014-12-14 MED ORDER — SUCCINYLCHOLINE CHLORIDE 20 MG/ML IJ SOLN
INTRAMUSCULAR | Status: AC
  Filled 2014-12-14: qty 1

## 2014-12-14 MED ORDER — KETOROLAC TROMETHAMINE 10 MG PO TABS: 10.0000 mg | ORAL_TABLET | Freq: Four times a day (QID) | ORAL | Status: DC | PRN

## 2014-12-14 MED ORDER — CEFAZOLIN SODIUM-DEXTROSE 2-3 GM-% IV SOLR
2.0000 g | INTRAVENOUS | Status: AC
  Administered 2014-12-14: 2 g via INTRAVENOUS
  Filled 2014-12-14: qty 50

## 2014-12-14 SURGICAL SUPPLY — 32 items
BAG HAMPER (MISCELLANEOUS) ×3 IMPLANT
CLOTH BEACON ORANGE TIMEOUT ST (SAFETY) ×3 IMPLANT
COVER LIGHT HANDLE STERIS (MISCELLANEOUS) ×6 IMPLANT
DECANTER SPIKE VIAL GLASS SM (MISCELLANEOUS) ×3 IMPLANT
DRAPE PROXIMA HALF (DRAPES) ×3 IMPLANT
DRAPE STERI URO 9X17 APER PCH (DRAPES) ×3 IMPLANT
ELECT REM PT RETURN 9FT ADLT (ELECTROSURGICAL) ×3
ELECTRODE REM PT RTRN 9FT ADLT (ELECTROSURGICAL) ×1 IMPLANT
FORMALIN 10 PREFIL 120ML (MISCELLANEOUS) ×3 IMPLANT
FORMALIN 10 PREFIL 480ML (MISCELLANEOUS) ×3 IMPLANT
GAUZE PACKING 2X5 YD STRL (GAUZE/BANDAGES/DRESSINGS) IMPLANT
GLOVE BIOGEL PI IND STRL 9 (GLOVE) ×1 IMPLANT
GLOVE BIOGEL PI INDICATOR 9 (GLOVE) ×2
GLOVE ECLIPSE 9.0 STRL (GLOVE) ×3 IMPLANT
GOWN SPEC L3 XXLG W/TWL (GOWN DISPOSABLE) ×6 IMPLANT
GOWN STRL REUS W/TWL LRG LVL3 (GOWN DISPOSABLE) ×3 IMPLANT
INST SET HYSTEROSCOPY (KITS) ×3 IMPLANT
IV NS 1000ML (IV SOLUTION) ×2
IV NS 1000ML BAXH (IV SOLUTION) ×1 IMPLANT
KIT ROOM TURNOVER APOR (KITS) ×3 IMPLANT
MANIFOLD NEPTUNE II (INSTRUMENTS) ×3 IMPLANT
NS IRRIG 1000ML POUR BTL (IV SOLUTION) ×3 IMPLANT
PACK PERI GYN (CUSTOM PROCEDURE TRAY) ×3 IMPLANT
PAD ARMBOARD 7.5X6 YLW CONV (MISCELLANEOUS) ×3 IMPLANT
PAD TELFA 3X4 1S STER (GAUZE/BANDAGES/DRESSINGS) ×3 IMPLANT
SET BASIN LINEN APH (SET/KITS/TRAYS/PACK) ×3 IMPLANT
SET CYSTO W/LG BORE CLAMP LF (SET/KITS/TRAYS/PACK) ×3 IMPLANT
SET IV ADMIN VERSALIGHT (MISCELLANEOUS) IMPLANT
SUT CHROMIC 2 0 CT 1 (SUTURE) ×3 IMPLANT
SUT PROLENE 2 0 FS (SUTURE) IMPLANT
SUT VIC AB 0 CT2 8-18 (SUTURE) IMPLANT
VERSALIGHT (MISCELLANEOUS) IMPLANT

## 2014-12-14 NOTE — Op Note (Signed)
Please see the brief operative note for operative details 

## 2014-12-14 NOTE — Anesthesia Procedure Notes (Signed)
Procedure Name: Intubation Date/Time: 12/14/2014 10:09 AM Performed by: Charmaine Downs Pre-anesthesia Checklist: Patient being monitored, Suction available, Emergency Drugs available and Patient identified Patient Re-evaluated:Patient Re-evaluated prior to inductionOxygen Delivery Method: Circle system utilized Preoxygenation: Pre-oxygenation with 100% oxygen Intubation Type: IV induction and Cricoid Pressure applied Ventilation: Mask ventilation without difficulty and Oral airway inserted - appropriate to patient size Laryngoscope Size: Mac and 3 Grade View: Grade II Tube type: Oral Tube size: 7.0 mm Number of attempts: 1 Airway Equipment and Method: Stylet and Oral airway Placement Confirmation: positive ETCO2,  ETT inserted through vocal cords under direct vision and breath sounds checked- equal and bilateral Secured at: 22 cm Tube secured with: Tape Dental Injury: Teeth and Oropharynx as per pre-operative assessment

## 2014-12-14 NOTE — Interval H&P Note (Signed)
History and Physical Interval Note:  12/14/2014 9:53 AM  Rachel Schaefer  has presented today for surgery, with the diagnosis of endometrial polyp  The various methods of treatment have been discussed with the patient and family. After consideration of risks, benefits and other options for treatment, the patient has consented to  Procedure(s): DILATATION AND CURETTAGE /HYSTEROSCOPY (N/A) POLYPECTOMY (N/A) as a surgical intervention .  The patient's history has been reviewed, patient examined, no change in status, stable for surgery.  I have reviewed the patient's chart and labs.  Questions were answered to the patient's satisfaction.  The patient's husband is with her at this time    [name]

## 2014-12-14 NOTE — Progress Notes (Signed)
Sero-sanguinous fluid about 50 cent piece on peripad.

## 2014-12-14 NOTE — Anesthesia Preprocedure Evaluation (Signed)
Anesthesia Evaluation  Patient identified by MRN, date of birth, ID band Patient awake    Reviewed: Allergy & Precautions, NPO status , Patient's Chart, lab work & pertinent test results  Airway Mallampati: II  TM Distance: >3 FB     Dental  (+) Teeth Intact   Pulmonary neg pulmonary ROS,  breath sounds clear to auscultation        Cardiovascular negative cardio ROS  Rhythm:Regular Rate:Normal     Neuro/Psych    GI/Hepatic hiatal hernia, GERD-  Medicated and Poorly Controlled,  Endo/Other    Renal/GU      Musculoskeletal   Abdominal   Peds  Hematology   Anesthesia Other Findings   Reproductive/Obstetrics                             Anesthesia Physical Anesthesia Plan  ASA: II  Anesthesia Plan: General   Post-op Pain Management:    Induction: Intravenous, Rapid sequence and Cricoid pressure planned  Airway Management Planned: Oral ETT  Additional Equipment:   Intra-op Plan:   Post-operative Plan: Extubation in OR  Informed Consent: I have reviewed the patients History and Physical, chart, labs and discussed the procedure including the risks, benefits and alternatives for the proposed anesthesia with the patient or authorized representative who has indicated his/her understanding and acceptance.     Plan Discussed with:   Anesthesia Plan Comments:         Anesthesia Quick Evaluation

## 2014-12-14 NOTE — Discharge Instructions (Signed)
Dilation and Curettage or Vacuum Curettage, Care After  Refer to this sheet in the next few weeks. These instructions provide you with information on caring for yourself after your procedure. Your health care provider may also give you more specific instructions. Your treatment has been planned according to current medical practices, but problems sometimes occur. Call your health care provider if you have any problems or questions after your procedure.  WHAT TO EXPECT AFTER THE PROCEDURE  After your procedure, it is typical to have light cramping and bleeding. This may last for 2 days to 2 weeks after the procedure.  HOME CARE INSTRUCTIONS   · Do not drive for 24 hours.  · Wait 1 week before returning to strenuous activities.  · Take your temperature 2 times a day for 4 days and write it down. Provide these temperatures to your health care provider if you develop a fever.  · Avoid long periods of standing.  · Avoid heavy lifting, pushing, or pulling. Do not lift anything heavier than 10 pounds (4.5 kg).  · Limit stair climbing to once or twice a day.  · Take rest periods often.  · You may resume your usual diet.  · Drink enough fluids to keep your urine clear or pale yellow.  · Your usual bowel function should return. If you have constipation, you may:  ¨ Take a mild laxative with permission from your health care provider.  ¨ Add fruit and bran to your diet.  ¨ Drink more fluids.  · Take showers instead of baths until your health care provider gives you permission to take baths.  · Do not go swimming or use a hot tub until your health care provider approves.  · Try to have someone with you or available to you the first 24-48 hours, especially if you were given a general anesthetic.  · Do not douche, use tampons, or have sex (intercourse) for 2 weeks after the procedure.  · Only take over-the-counter or prescription medicines as directed by your health care provider. Do not take aspirin. It can cause  bleeding.  · Follow up with your health care provider as directed.  SEEK MEDICAL CARE IF:   · You have increasing cramps or pain that is not relieved with medicine.  · You have abdominal pain that does not seem to be related to the same area of earlier cramping and pain.  · You have bad smelling vaginal discharge.  · You have a rash.  · You are having problems with any medicine.  SEEK IMMEDIATE MEDICAL CARE IF:   · You have bleeding that is heavier than a normal menstrual period.  · You have a fever.  · You have chest pain.  · You have shortness of breath.  · You feel dizzy or feel like fainting.  · You pass out.  · You have pain in your shoulder strap area.  · You have heavy vaginal bleeding with or without blood clots.  MAKE SURE YOU:   · Understand these instructions.  · Will watch your condition.  · Will get help right away if you are not doing well or get worse.  Document Released: 08/31/2000 Document Revised: 09/08/2013 Document Reviewed: 04/02/2013  [employer]® Patient Information ©2015 [employer]. This information is not intended to replace advice given to you by your health care provider. Make sure you discuss any questions you have with your health care provider.

## 2014-12-14 NOTE — Transfer of Care (Signed)
Immediate Anesthesia Transfer of Care Note  Patient: Rachel Schaefer  Procedure(s) Performed: Procedure(s): DILATATION AND CURETTAGE /HYSTEROSCOPY (N/A) POLYPECTOMY (N/A)  Patient Location: PACU  Anesthesia Type:General  Level of Consciousness: awake and patient cooperative  Airway & Oxygen Therapy: Patient Spontanous Breathing and Patient connected to face mask oxygen  Post-op Assessment: Report given to RN, Post -op Vital signs reviewed and stable and Patient moving all extremities  Post vital signs: Reviewed and stable  Last Vitals:  Filed Vitals:   12/14/14 0955  BP: 106/69  Pulse:   Temp:   Resp: 29    Complications: No apparent anesthesia complications

## 2014-12-14 NOTE — Brief Op Note (Signed)
12/14/2014  11:23 AM  PATIENT:  Rachel Schaefer  59 y.o. female  PRE-OPERATIVE DIAGNOSIS:  endometrial polyp .  POST-OPERATIVE DIAGNOSIS:  endometrial polyp, cervical stenosis,postmenopausel bleeding  PROCEDURE:  Procedure(s): DILATATION AND CURETTAGE /HYSTEROSCOPY (N/A) POLYPECTOMY (N/A)  SURGEON:  Surgeon(s) and Role:    * [name], MD - Primary  PHYSICIAN ASSISTANT:   ASSISTANTS: none   ANESTHESIA:   general  EBL:  Total I/O In: 700 [I.V.:700] Out: -   BLOOD ADMINISTERED:none  DRAINS: none   LOCAL MEDICATIONS USED:  NONE  SPECIMEN:  Source of Specimen:  endometrial polyps  DISPOSITION OF SPECIMEN:  PATHOLOGY  COUNTS:  YES  TOURNIQUET:  * No tourniquets in log *  DICTATION: .Dragon Dictation  PLAN OF CARE: Discharge to home after PACU  PATIENT DISPOSITION:  PACU - hemodynamically stable.   Delay start of Pharmacological VTE agent (>24hrs) due to surgical blood loss or risk of bleeding: not applicable Details of procedure: Patient was taken operating room prepped and draped for vaginal procedure with timeout conducted. Cervix was grasped with single-tooth tenaculum on the anterior and posterior lip due to the stenosis, and with care the uterine sound could be passed through the stenotic endocervix identifying uterine cavity of 7 cm length. Cervix and lower uterine segment were dilated to 25 [place] allowing introduction of the 30 operative hysteroscope which identified multiple small polyps and 2 large 1 cm  polyps hysteroscopic scissors were then used to tape the smaller polyps off at their base rectum visually guided. The larger polyps Heather base at the apex of the uterine cavity and could only be partially resected with the hysteroscope.  Stone forceps could then be used to grasp the polyp tissue and extracted. Hysteroscopy was repeated as needed throughout the case to confirm that proper positioning of the Randall stone forceps remained. Upon completion of  the procedure the endometrial cavity somewhat obscured by blood but there was no suspicion of perforation. The ostia could be easily visualized. The polyp originated Near the left tubal ostia. Satisfactory evacuation of the uterus was completed, and the cervix identified as having a slightly enlarged opening where the tenaculum had been holding the cervix on the anterior lip and a single stitch of 2-0 chromic was placed across the small opening from the tenaculum with good hemostasis resulting. EBL 50 cc condition to recovery in good

## 2014-12-14 NOTE — Anesthesia Postprocedure Evaluation (Signed)
  Anesthesia Post-op Note  Patient: Rachel Schaefer  Procedure(s) Performed: Procedure(s): DILATATION AND CURETTAGE /HYSTEROSCOPY (N/A) POLYPECTOMY (N/A)  Patient Location: PACU  Anesthesia Type:General  Level of Consciousness: awake, alert , oriented and patient cooperative  Airway and Oxygen Therapy: Patient Spontanous Breathing  Post-op Pain: 2 /10, mild  Post-op Assessment: Post-op Vital signs reviewed, Patient's Cardiovascular Status Stable, Respiratory Function Stable, Patent Airway, No signs of Nausea or vomiting and Pain level controlled  Post-op Vital Signs: Reviewed and stable  Last Vitals:  Filed Vitals:   12/14/14 0955  BP: 106/69  Pulse:   Temp:   Resp: 29    Complications: No apparent anesthesia complications

## 2014-12-15 ENCOUNTER — Telehealth: Payer: Self-pay | Admitting: Obstetrics and Gynecology

## 2014-12-15 ENCOUNTER — Encounter (HOSPITAL_COMMUNITY): Payer: Self-pay | Admitting: Obstetrics and Gynecology

## 2014-12-15 NOTE — Telephone Encounter (Signed)
Benign tissue, pt informed. Pt doing well

## 2014-12-21 ENCOUNTER — Encounter: Payer: Self-pay | Admitting: Obstetrics and Gynecology

## 2014-12-21 ENCOUNTER — Ambulatory Visit (INDEPENDENT_AMBULATORY_CARE_PROVIDER_SITE_OTHER): Payer: BC Managed Care – PPO | Admitting: Obstetrics and Gynecology

## 2014-12-21 VITALS — BP 124/80 | Ht 60.0 in | Wt 194.0 lb

## 2014-12-21 DIAGNOSIS — Z9889 Other specified postprocedural states: Secondary | ICD-10-CM

## 2014-12-21 NOTE — Progress Notes (Signed)
Patient ID: Rachel Schaefer, female   DOB: 1956/07/11, 59 y.o.   MRN: [id number] Pt here today for post op visit. Pt states that she is still having a very light bleed. Pt denies any problems or concerns at this time.      Subjective:  Rachel Schaefer is a 59 y.o. female now 1 weeks status post operative hysteroscopy.    polyps found and removed Review of Systems Negative except light spotting, no postop pain r diet:   regular   Bowel movements : normal.  The patient is not having any pain.  Objective:  BP 124/80 mmHg  Ht 5' (1.524 m)  Wt 194 lb (87.998 kg)  BMI 37.89 kg/m2  LMP 12/14/2014 General:Well developed, well nourished.  No acute distress. Abdomen: Bowel sounds normal, soft, non-tender. Pelvic Exam:    External Genitalia:  Normal.    Vagina: Normal    Cervix: healing s/p stitch    Uterus: nontender    Adnexa/Bimanual: Normal  Incision(s):   Healing cervix, no drainage, no erythema, no hernia, no swelling, no dehiscence,     Assessment:  Post-Op 1 weeks s/p operative hysteroscopy   amd removal of polyp Doing well postoperatively.   Plan:  1.Wound care discussed   2. . current medications.n/a 3. Activity restrictions: none 4. return to work: not applicable. 5. Follow up in 1 yr.

## 2015-03-11 ENCOUNTER — Other Ambulatory Visit: Payer: Self-pay | Admitting: Obstetrics and Gynecology

## 2015-03-11 DIAGNOSIS — Z1231 Encounter for screening mammogram for malignant neoplasm of breast: Secondary | ICD-10-CM

## 2015-04-07 ENCOUNTER — Ambulatory Visit (HOSPITAL_COMMUNITY)
Admission: RE | Admit: 2015-04-07 | Discharge: 2015-04-07 | Disposition: A | Discharge status: Home / Self Care | Payer: BC Managed Care – PPO | Source: Ambulatory Visit | Attending: Obstetrics and Gynecology | Admitting: Obstetrics and Gynecology

## 2015-04-07 DIAGNOSIS — Z1231 Encounter for screening mammogram for malignant neoplasm of breast: Secondary | ICD-10-CM | POA: Diagnosis present

## 2016-03-23 ENCOUNTER — Other Ambulatory Visit: Payer: Self-pay | Admitting: Obstetrics and Gynecology

## 2016-03-23 DIAGNOSIS — Z1231 Encounter for screening mammogram for malignant neoplasm of breast: Secondary | ICD-10-CM

## 2016-04-11 ENCOUNTER — Ambulatory Visit (HOSPITAL_COMMUNITY)
Admission: RE | Admit: 2016-04-11 | Discharge: 2016-04-11 | Disposition: A | Discharge status: Home / Self Care | Payer: BC Managed Care – PPO | Source: Ambulatory Visit | Attending: Obstetrics and Gynecology | Admitting: Obstetrics and Gynecology

## 2016-04-11 DIAGNOSIS — Z1231 Encounter for screening mammogram for malignant neoplasm of breast: Secondary | ICD-10-CM | POA: Insufficient documentation

## 2016-09-03 ENCOUNTER — Other Ambulatory Visit (HOSPITAL_COMMUNITY): Payer: Self-pay | Admitting: Nephrology

## 2016-09-03 DIAGNOSIS — N183 Chronic kidney disease, stage 3 unspecified: Secondary | ICD-10-CM

## 2016-09-07 ENCOUNTER — Ambulatory Visit (HOSPITAL_COMMUNITY)
Admission: RE | Admit: 2016-09-07 | Discharge: 2016-09-07 | Disposition: A | Discharge status: Home / Self Care | Payer: BC Managed Care – PPO | Source: Ambulatory Visit | Attending: Nephrology | Admitting: Nephrology

## 2016-09-07 ENCOUNTER — Ambulatory Visit (HOSPITAL_COMMUNITY): Payer: BC Managed Care – PPO

## 2016-09-07 DIAGNOSIS — N183 Chronic kidney disease, stage 3 unspecified: Secondary | ICD-10-CM

## 2016-09-12 ENCOUNTER — Ambulatory Visit (HOSPITAL_COMMUNITY): Payer: BC Managed Care – PPO

## 2016-10-04 ENCOUNTER — Ambulatory Visit (HOSPITAL_COMMUNITY): Payer: BC Managed Care – PPO

## 2016-10-19 ENCOUNTER — Encounter: Payer: Self-pay | Admitting: *Deleted

## 2017-03-18 ENCOUNTER — Other Ambulatory Visit: Payer: Self-pay | Admitting: Obstetrics and Gynecology

## 2017-03-18 DIAGNOSIS — Z1231 Encounter for screening mammogram for malignant neoplasm of breast: Secondary | ICD-10-CM

## 2017-04-15 ENCOUNTER — Ambulatory Visit (HOSPITAL_COMMUNITY)
Admission: RE | Admit: 2017-04-15 | Discharge: 2017-04-15 | Disposition: A | Discharge status: Home / Self Care | Payer: BC Managed Care – PPO | Source: Ambulatory Visit | Attending: Obstetrics and Gynecology | Admitting: Obstetrics and Gynecology

## 2017-04-15 DIAGNOSIS — Z1231 Encounter for screening mammogram for malignant neoplasm of breast: Secondary | ICD-10-CM

## 2018-03-10 ENCOUNTER — Other Ambulatory Visit: Payer: Self-pay | Admitting: Obstetrics and Gynecology

## 2018-03-10 DIAGNOSIS — Z1231 Encounter for screening mammogram for malignant neoplasm of breast: Secondary | ICD-10-CM

## 2018-04-16 ENCOUNTER — Other Ambulatory Visit: Payer: Self-pay | Admitting: Obstetrics and Gynecology

## 2018-04-16 ENCOUNTER — Ambulatory Visit (HOSPITAL_COMMUNITY)
Admission: RE | Admit: 2018-04-16 | Discharge: 2018-04-16 | Disposition: A | Discharge status: Home / Self Care | Payer: BC Managed Care – PPO | Source: Ambulatory Visit | Attending: Obstetrics and Gynecology | Admitting: Obstetrics and Gynecology

## 2018-04-16 ENCOUNTER — Ambulatory Visit (HOSPITAL_COMMUNITY): Payer: BC Managed Care – PPO

## 2018-04-16 DIAGNOSIS — Z1231 Encounter for screening mammogram for malignant neoplasm of breast: Secondary | ICD-10-CM | POA: Diagnosis not present

## 2019-03-18 ENCOUNTER — Other Ambulatory Visit (HOSPITAL_COMMUNITY): Payer: Self-pay | Admitting: Obstetrics and Gynecology

## 2019-03-18 DIAGNOSIS — Z1231 Encounter for screening mammogram for malignant neoplasm of breast: Secondary | ICD-10-CM

## 2019-04-24 ENCOUNTER — Ambulatory Visit (HOSPITAL_COMMUNITY)
Admission: RE | Admit: 2019-04-24 | Discharge: 2019-04-24 | Disposition: A | Discharge status: Home / Self Care | Payer: BC Managed Care – PPO | Source: Ambulatory Visit | Attending: Obstetrics and Gynecology | Admitting: Obstetrics and Gynecology

## 2019-04-24 ENCOUNTER — Other Ambulatory Visit: Payer: Self-pay

## 2019-04-24 DIAGNOSIS — Z1231 Encounter for screening mammogram for malignant neoplasm of breast: Secondary | ICD-10-CM | POA: Insufficient documentation

## 2020-03-29 ENCOUNTER — Other Ambulatory Visit (HOSPITAL_COMMUNITY): Payer: Self-pay | Admitting: Obstetrics and Gynecology

## 2020-03-29 DIAGNOSIS — Z1231 Encounter for screening mammogram for malignant neoplasm of breast: Secondary | ICD-10-CM

## 2020-04-25 ENCOUNTER — Ambulatory Visit (HOSPITAL_COMMUNITY)
Admission: RE | Admit: 2020-04-25 | Discharge: 2020-04-25 | Disposition: A | Discharge status: Home / Self Care | Payer: BC Managed Care – PPO | Source: Ambulatory Visit | Attending: Obstetrics and Gynecology | Admitting: Obstetrics and Gynecology

## 2020-04-25 ENCOUNTER — Other Ambulatory Visit: Payer: Self-pay

## 2020-04-25 DIAGNOSIS — Z1231 Encounter for screening mammogram for malignant neoplasm of breast: Secondary | ICD-10-CM | POA: Insufficient documentation

## 2020-09-09 IMAGING — MG DIGITAL SCREENING BILAT W/ TOMO W/ CAD
8 series · 8 of 24 positions shown · non-contrast
Comparison: Previous exam(s).

CLINICAL DATA: Screening.

EXAM:
DIGITAL SCREENING BILATERAL MAMMOGRAM WITH TOMO AND CAD

[R MLO synth-2D]
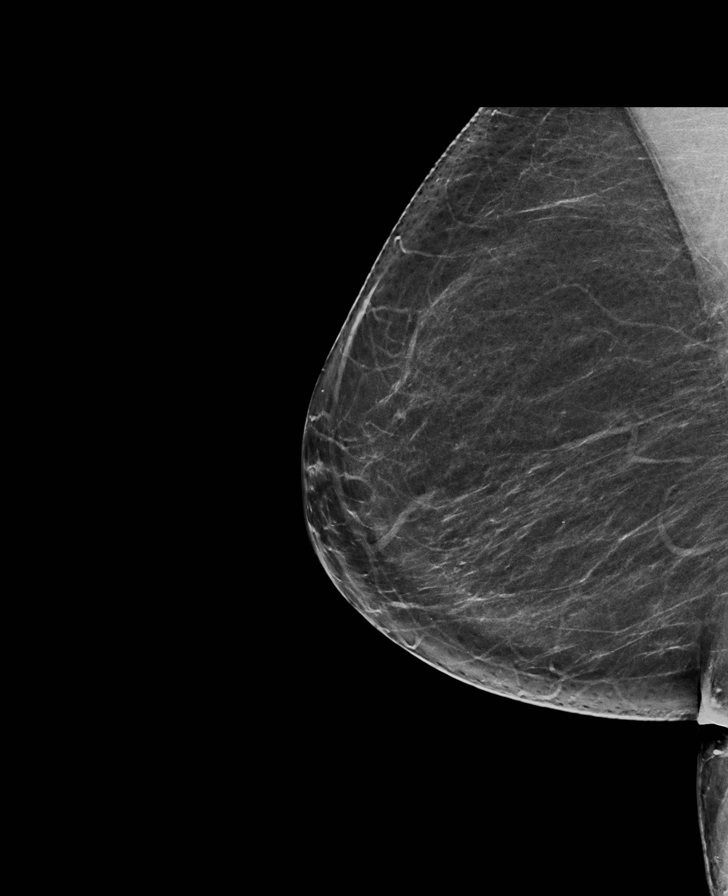

[L CC synth-2D]
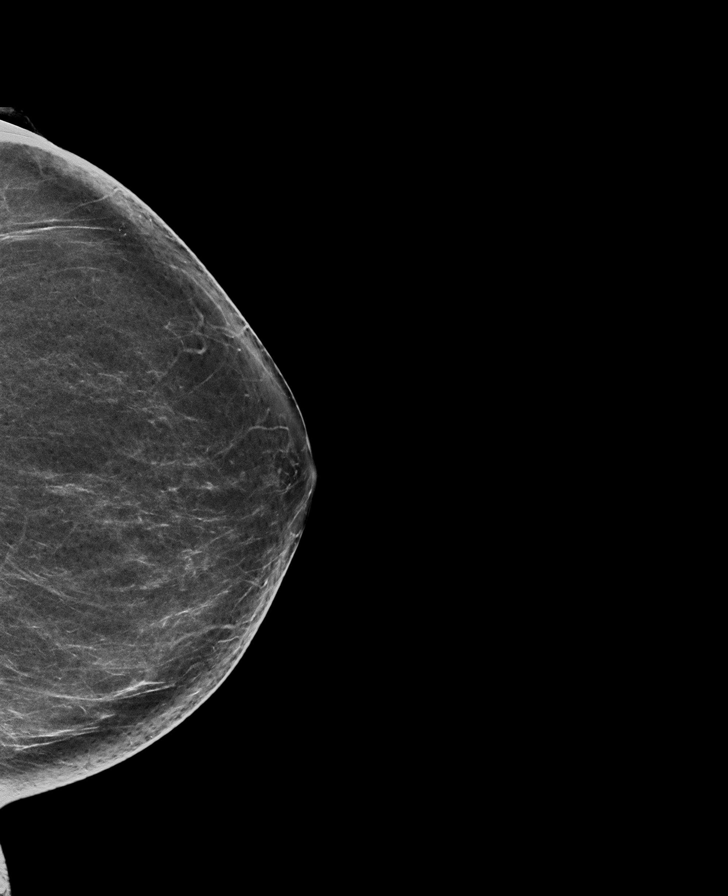

[L MLO synth-2D]
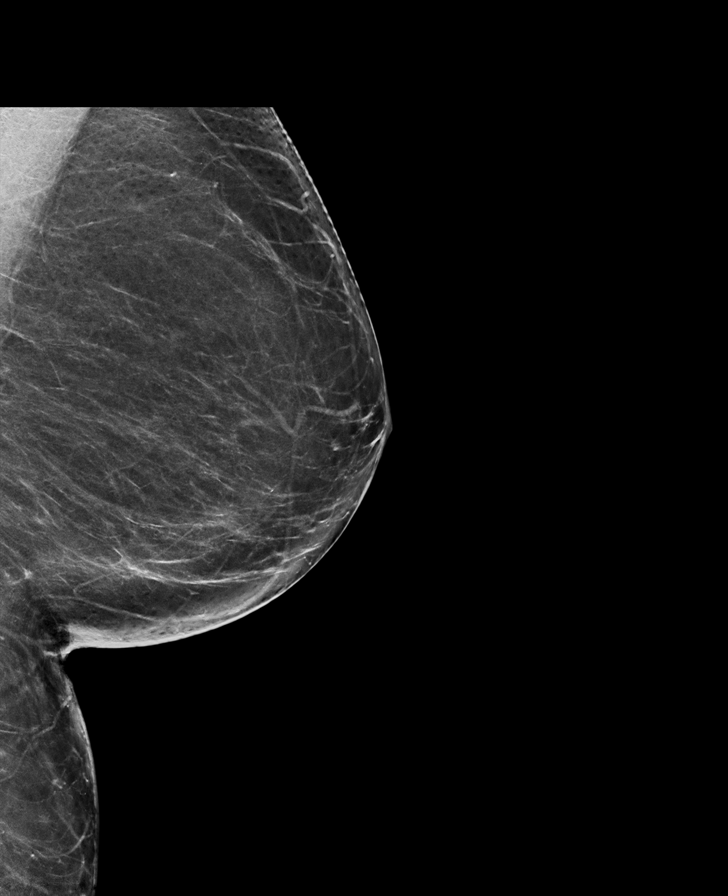

[R CC synth-2D]
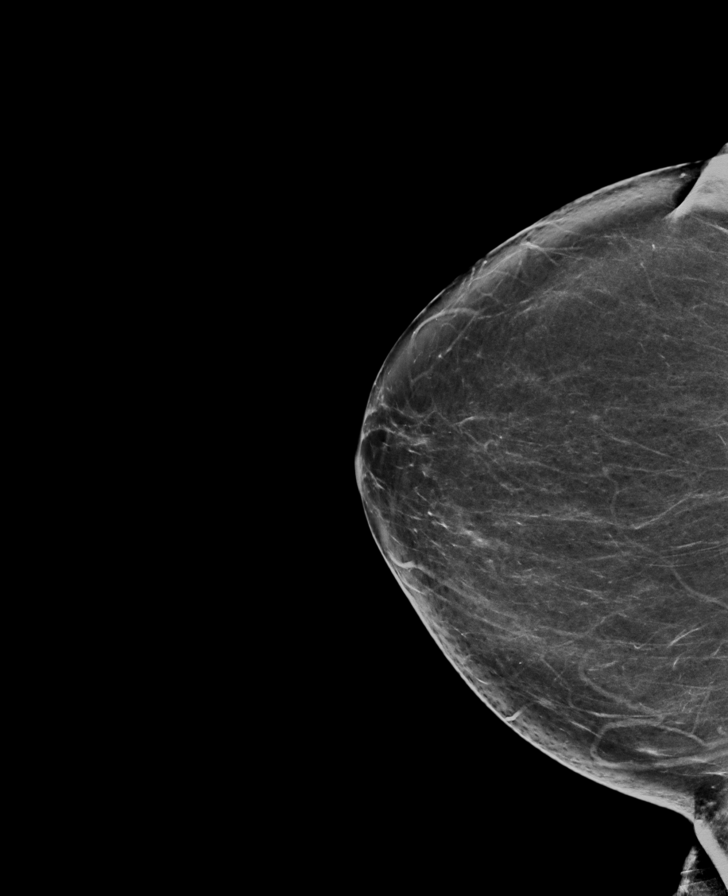

[R MLO tomo · tomo slice 35/69.0]
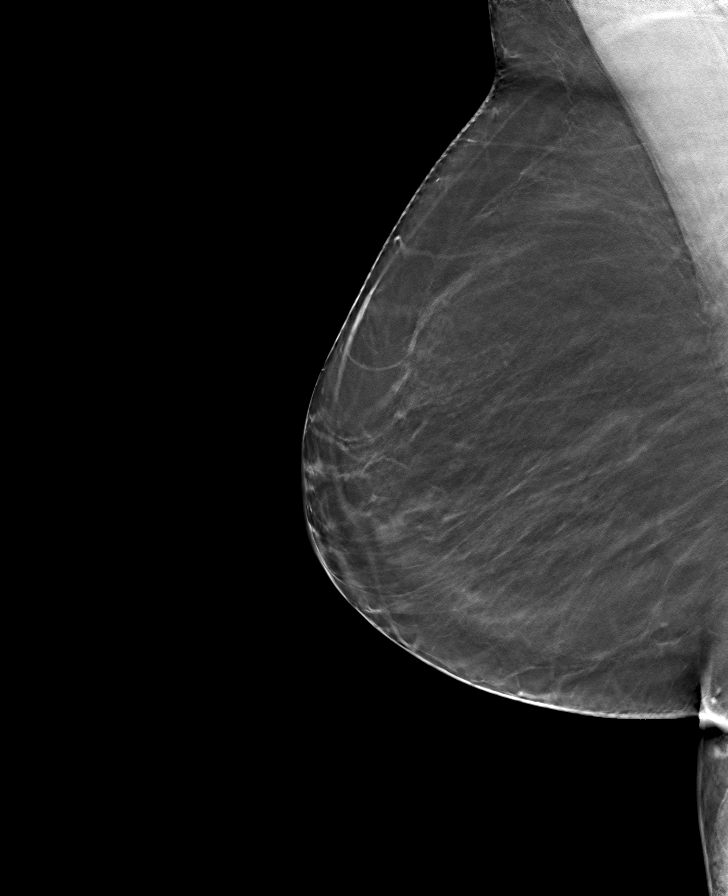

[L MLO tomo · tomo slice 39/76.0]
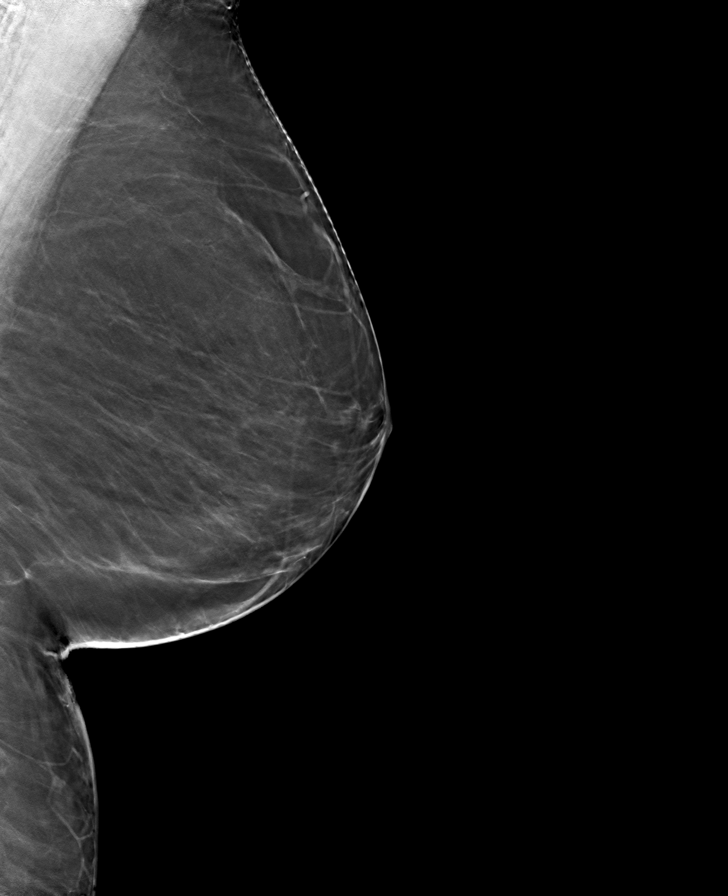

[R CC tomo · tomo slice 39/77.0]
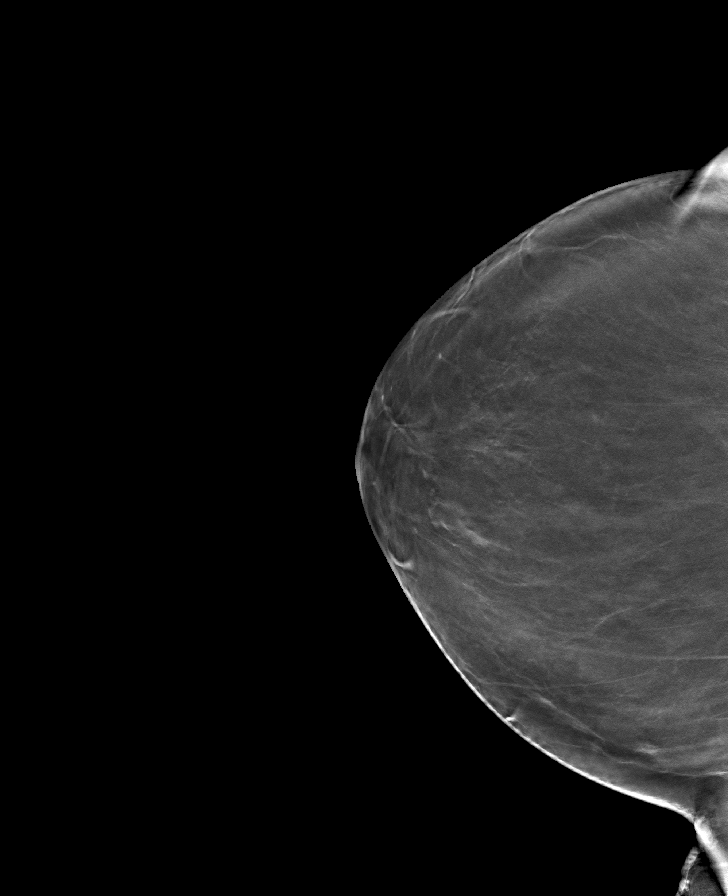

[L CC tomo · tomo slice 39/76.0]
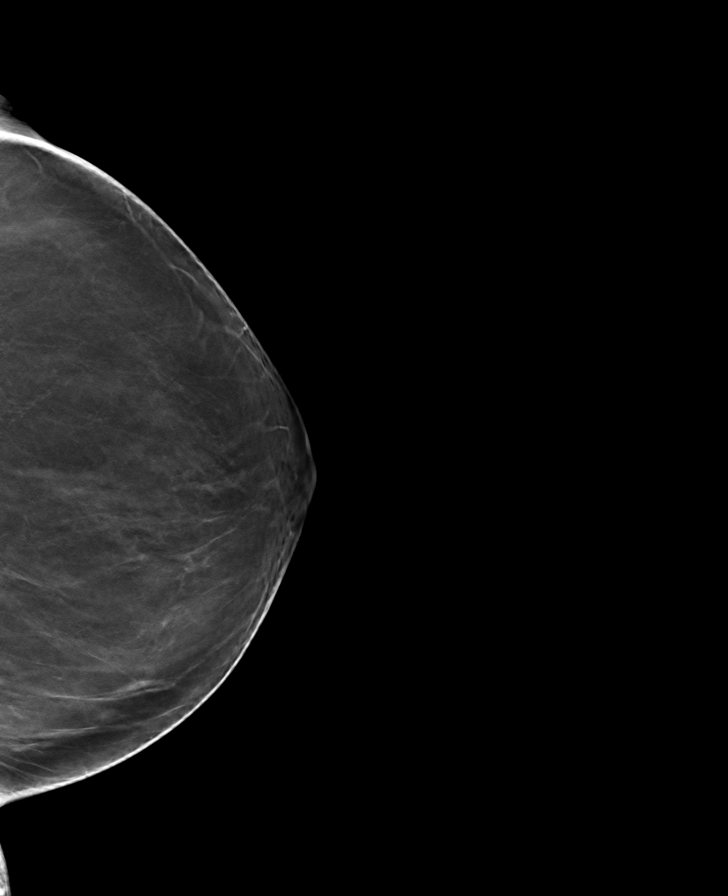

[8 of 24 positions shown; findings below may reference images not displayed]

ACR Breast Density Category b: There are scattered areas of
fibroglandular density.
FINDINGS: There are no findings suspicious for malignancy. Images were
processed with CAD.
IMPRESSION: No mammographic evidence of malignancy. A result letter of this
screening mammogram will be mailed directly to the patient.

RECOMMENDATION:
Screening mammogram in one year. (Code:CN-U-775)

BI-RADS CATEGORY  1: Negative.

## 2020-12-09 ENCOUNTER — Other Ambulatory Visit (HOSPITAL_COMMUNITY): Payer: Self-pay | Admitting: Family Medicine

## 2020-12-09 DIAGNOSIS — Z13820 Encounter for screening for osteoporosis: Secondary | ICD-10-CM

## 2020-12-22 ENCOUNTER — Other Ambulatory Visit (HOSPITAL_COMMUNITY): Payer: BC Managed Care – PPO

## 2021-02-09 ENCOUNTER — Other Ambulatory Visit: Payer: Self-pay

## 2021-02-09 ENCOUNTER — Ambulatory Visit (HOSPITAL_COMMUNITY)
Admission: RE | Admit: 2021-02-09 | Discharge: 2021-02-09 | Disposition: A | Discharge status: Home / Self Care | Payer: Medicare Other | Source: Ambulatory Visit | Attending: Family Medicine | Admitting: Family Medicine

## 2021-02-09 DIAGNOSIS — Z13820 Encounter for screening for osteoporosis: Secondary | ICD-10-CM | POA: Insufficient documentation

## 2021-02-09 DIAGNOSIS — Z780 Asymptomatic menopausal state: Secondary | ICD-10-CM | POA: Diagnosis not present

## 2021-03-17 ENCOUNTER — Other Ambulatory Visit (HOSPITAL_COMMUNITY): Payer: Self-pay | Admitting: Nurse Practitioner

## 2021-03-17 ENCOUNTER — Other Ambulatory Visit (HOSPITAL_COMMUNITY): Payer: Self-pay | Admitting: Family Medicine

## 2021-03-17 DIAGNOSIS — Z1231 Encounter for screening mammogram for malignant neoplasm of breast: Secondary | ICD-10-CM

## 2021-04-28 ENCOUNTER — Other Ambulatory Visit: Payer: Self-pay

## 2021-04-28 ENCOUNTER — Ambulatory Visit (HOSPITAL_COMMUNITY)
Admission: RE | Admit: 2021-04-28 | Discharge: 2021-04-28 | Disposition: A | Discharge status: Home / Self Care | Payer: Medicare Other | Source: Ambulatory Visit | Attending: Family Medicine | Admitting: Family Medicine

## 2021-04-28 DIAGNOSIS — Z1231 Encounter for screening mammogram for malignant neoplasm of breast: Secondary | ICD-10-CM | POA: Diagnosis present

## 2022-03-28 ENCOUNTER — Other Ambulatory Visit (HOSPITAL_COMMUNITY): Payer: Self-pay | Admitting: Nurse Practitioner

## 2022-04-04 ENCOUNTER — Other Ambulatory Visit (HOSPITAL_COMMUNITY): Payer: Self-pay | Admitting: Family Medicine

## 2022-04-04 DIAGNOSIS — Z1231 Encounter for screening mammogram for malignant neoplasm of breast: Secondary | ICD-10-CM

## 2022-04-30 ENCOUNTER — Ambulatory Visit (HOSPITAL_COMMUNITY)
Admission: RE | Admit: 2022-04-30 | Discharge: 2022-04-30 | Disposition: A | Discharge status: Home / Self Care | Payer: Medicare PPO | Source: Ambulatory Visit | Attending: Family Medicine | Admitting: Family Medicine

## 2022-04-30 DIAGNOSIS — Z1231 Encounter for screening mammogram for malignant neoplasm of breast: Secondary | ICD-10-CM | POA: Diagnosis present

## 2022-08-23 ENCOUNTER — Encounter (INDEPENDENT_AMBULATORY_CARE_PROVIDER_SITE_OTHER): Payer: Self-pay | Admitting: *Deleted

## 2022-11-28 ENCOUNTER — Encounter: Payer: Self-pay | Admitting: Internal Medicine

## 2022-12-29 NOTE — Progress Notes (Deleted)
Referring Provider: [facility] Primary Care Physician:  Karl Bales, NP Primary Gastroenterologist:  Dr. Bonnetta Barry chief complaint on file.   HPI:   Rachel Schaefer is a 67 y.o. female presenting today at the request of [facility] for diarrhea  Past Medical History:  Diagnosis Date   Acid reflux    Hernia, hiatal     Past Surgical History:  Procedure Laterality Date   CESAREAN SECTION     HYSTEROSCOPY WITH D & C N/A 12/14/2014   Procedure: DILATATION AND CURETTAGE /HYSTEROSCOPY;  Surgeon: Tilda Burrow, MD;  Location: [facility];  Service: Gynecology;  Laterality: N/A;   POLYPECTOMY N/A 12/14/2014   Procedure: POLYPECTOMY;  Surgeon: Tilda Burrow, MD;  Location: [facility];  Service: Gynecology;  Laterality: N/A;    Current Outpatient Medications  Medication Sig Dispense Refill   Omeprazole (PRILOSEC PO) Take by mouth daily.     No current facility-administered medications for this visit.    Allergies as of 12/31/2022   (No Known Allergies)    Family History  Problem Relation Age of Onset   Diabetes Mother    Hypertension Mother    Hyperlipidemia Mother    Glaucoma Mother    Diabetes Brother    Hypertension Brother    Heart attack Brother    Heart disease Brother    Hypertension Sister    Cancer Maternal Aunt        breast   Stroke Maternal Grandmother     Social History   Socioeconomic History   Marital status: Married    Spouse name: Not on file   Number of children: Not on file   Years of education: Not on file   Highest education level: Not on file  Occupational History   Not on file  Tobacco Use   Smoking status: Never   Smokeless tobacco: Never  Substance and Sexual Activity   Alcohol use: No   Drug use: No   Sexual activity: Not Currently    Birth control/protection: Post-menopausal  Other Topics Concern   Not on file  Social History Narrative   Not on file   Social Determinants of Health   Financial Resource  Strain: Not on file  Food Insecurity: Not on file  Transportation Needs: Not on file  Physical Activity: Not on file  Stress: Not on file  Social Connections: Not on file  Intimate Partner Violence: Not on file    Review of Systems: Gen: Denies any fever, chills, fatigue, weight loss, lack of appetite.  CV: Denies chest pain, heart palpitations, peripheral edema, syncope.  Resp: Denies shortness of breath at rest or with exertion. Denies wheezing or cough.  GI: Denies dysphagia or odynophagia. Denies jaundice, hematemesis, fecal incontinence. GU : Denies urinary burning, urinary frequency, urinary hesitancy MS: Denies joint pain, muscle weakness, cramps, or limitation of movement.  Derm: Denies rash, itching, dry skin Psych: Denies depression, anxiety, memory loss, and confusion Heme: Denies bruising, bleeding, and enlarged lymph nodes.  Physical Exam: LMP 12/14/2014 Comment: spotting General:   Alert and oriented. Pleasant and cooperative. Well-nourished and well-developed.  Head:  Normocephalic and atraumatic. Eyes:  Without icterus, sclera clear and conjunctiva pink.  Ears:  Normal auditory acuity. Lungs:  Clear to auscultation bilaterally. No wheezes, rales, or rhonchi. No distress.  Heart:  S1, S2 present without murmurs appreciated.  Abdomen:  +BS, soft, non-tender and non-distended. No HSM noted. No guarding or rebound. No masses appreciated.  Rectal:  Deferred  Msk:  Symmetrical without gross deformities. Normal posture. Extremities:  Without edema. Neurologic:  Alert and  oriented x4;  grossly normal neurologically. Skin:  Intact without significant lesions or rashes. Psych:  Alert and cooperative. Normal mood and affect.    Assessment:     Plan:  ***   Ermalinda Memos, PA-C [facility] 12/31/2022

## 2022-12-31 ENCOUNTER — Encounter: Payer: Self-pay | Admitting: Gastroenterology

## 2022-12-31 ENCOUNTER — Ambulatory Visit: Payer: Medicare PPO | Admitting: Gastroenterology

## 2023-01-06 NOTE — Progress Notes (Unsigned)
GI Office Note    Referring Provider: Karl Bales, NP Primary Care Physician:  Karl Bales, NP  Primary Gastroenterologist: Gerrit Friends.Rourk, MD  Chief Complaint   No chief complaint on file.   History of Present Illness   Rachel Schaefer is a 67 y.o. female presenting today at the request of Jones, Kristi S, NP for evaluation of frequent loose stools x1 month and need for screening colonoscopy .  PCP visit 11/23/22. Noted to have frequent loose stools for 1 month without abdominal pain. Denied family history of GI disease. Was to obtain stool studies and advised trial of imodium.    Stool studies: ***  Today:   Current Outpatient Medications  Medication Sig Dispense Refill   Omeprazole (PRILOSEC PO) Take by mouth daily.     No current facility-administered medications for this visit.    Past Medical History:  Diagnosis Date   Acid reflux    Hernia, hiatal     Past Surgical History:  Procedure Laterality Date   CESAREAN SECTION     HYSTEROSCOPY WITH D & C N/A 12/14/2014   Procedure: DILATATION AND CURETTAGE /HYSTEROSCOPY;  Surgeon: Tilda Burrow, MD;  Location: [facility];  Service: Gynecology;  Laterality: N/A;   POLYPECTOMY N/A 12/14/2014   Procedure: POLYPECTOMY;  Surgeon: Tilda Burrow, MD;  Location: [facility];  Service: Gynecology;  Laterality: N/A;    Family History  Problem Relation Age of Onset   Diabetes Mother    Hypertension Mother    Hyperlipidemia Mother    Glaucoma Mother    Diabetes Brother    Hypertension Brother    Heart attack Brother    Heart disease Brother    Hypertension Sister    Cancer Maternal Aunt        breast   Stroke Maternal Grandmother     Allergies as of 01/07/2023   (No Known Allergies)    Social History   Socioeconomic History   Marital status: Married    Spouse name: Not on file   Number of children: Not on file   Years of education: Not on file   Highest education level: Not on file  Occupational History    Not on file  Tobacco Use   Smoking status: Never   Smokeless tobacco: Never  Substance and Sexual Activity   Alcohol use: No   Drug use: No   Sexual activity: Not Currently    Birth control/protection: Post-menopausal  Other Topics Concern   Not on file  Social History Narrative   Not on file   Social Determinants of Health   Financial Resource Strain: Not on file  Food Insecurity: Not on file  Transportation Needs: Not on file  Physical Activity: Not on file  Stress: Not on file  Social Connections: Not on file  Intimate Partner Violence: Not on file     Review of Systems   Gen: Denies any fever, chills, fatigue, weight loss, lack of appetite.  CV: Denies chest pain, heart palpitations, peripheral edema, syncope.  Resp: Denies shortness of breath at rest or with exertion. Denies wheezing or cough.  GI: see HPI GU : Denies urinary burning, urinary frequency, urinary hesitancy MS: Denies joint pain, muscle weakness, cramps, or limitation of movement.  Derm: Denies rash, itching, dry skin Psych: Denies depression, anxiety, memory loss, and confusion Heme: Denies bruising, bleeding, and enlarged lymph nodes.   Physical Exam   LMP 12/14/2014 Comment: spotting  General:   Alert and oriented.  Pleasant and cooperative. Well-nourished and well-developed.  Head:  Normocephalic and atraumatic. Eyes:  Without icterus, sclera clear and conjunctiva pink.  Ears:  Normal auditory acuity. Mouth:  No deformity or lesions, oral mucosa pink.  Lungs:  Clear to auscultation bilaterally. No wheezes, rales, or rhonchi. No distress.  Heart:  S1, S2 present without murmurs appreciated.  Abdomen:  +BS, soft, non-tender and non-distended. No HSM noted. No guarding or rebound. No masses appreciated.  Rectal:  Deferred  Msk:  Symmetrical without gross deformities. Normal posture. Extremities:  Without edema. Neurologic:  Alert and  oriented x4;  grossly normal neurologically. Skin:   Intact without significant lesions or rashes. Psych:  Alert and cooperative. Normal mood and affect.   Assessment   Rachel Schaefer is a 67 y.o. female with a history of acid reflux presenting today to schedule colonoscopy and further evaluation of loose stools.   Loose stools:   Screening for colon cancer:    PLAN   *** Proceed with colonoscopy with propofol by Dr. Jena Gauss in near future: the risks, benefits, and alternatives have been discussed with the patient in detail. The patient states understanding and desires to proceed. ASA *** CBC, CMP, celiac serologies, TSH, stool studies?    Brooke Bonito, MSN, FNP-BC, AGACNP-BC [facility]

## 2023-01-07 ENCOUNTER — Ambulatory Visit: Payer: Medicare PPO | Admitting: Gastroenterology

## 2023-01-07 ENCOUNTER — Encounter: Payer: Self-pay | Admitting: Gastroenterology

## 2023-01-07 VITALS — BP 134/85 | HR 59 | Temp 97.8°F | Ht 60.0 in | Wt 172.2 lb

## 2023-01-07 DIAGNOSIS — R195 Other fecal abnormalities: Secondary | ICD-10-CM

## 2023-01-07 DIAGNOSIS — Z1211 Encounter for screening for malignant neoplasm of colon: Secondary | ICD-10-CM | POA: Diagnosis not present

## 2023-01-07 DIAGNOSIS — K219 Gastro-esophageal reflux disease without esophagitis: Secondary | ICD-10-CM | POA: Diagnosis not present

## 2023-01-07 NOTE — Patient Instructions (Addendum)
We are scheduling you for a colonoscopy in the near future with Dr. Jena Gauss.   I want you to try following a low FODMAP diet, handout is attached.  Please pick a few things to eliminate at once within 1 group and see if symptoms improve.  If symptoms do not improve you may reintroduce and take another few foods to avoid and repeat the process.  Keeping a food journal/log may help you with this.  I am ordering some additional lab work for you to have performed to assess for cause of your loose stools as well as an additional stool sample.  Continue taking omeprazole 20 mg once daily, 30 minutes prior to breakfast. Follow a GERD diet:  Avoid fried, fatty, greasy, spicy, citrus foods. Avoid caffeine and carbonated beverages. Avoid chocolate. Try eating 4-6 small meals a day rather than 3 large meals. Do not eat within 3 hours of laying down. Prop head of bed up on wood or bricks to create a 6 inch incline.   Avoid gas-producing foods (eg, cabbage, legumes, onions, broccoli, brussel sprouts, wheat, and potatoes).  You may use Gas-X over-the-counter as needed.  Follow the directions on the bottle.  You may use this once or twice daily as needed.  You may continue to use Imodium once to twice daily as needed for loose stools.  If you need to take this prior to going out to dinner for a meal this is recommended.  It was a pleasure to see you today. I want to create trusting relationships with patients. If you receive a survey regarding your visit,  I greatly appreciate you taking time to fill this out on paper or through your MyChart. I value your feedback.  Brooke Bonito, MSN, FNP-BC, AGACNP-BC [facility]

## 2023-01-08 ENCOUNTER — Other Ambulatory Visit: Payer: Self-pay | Admitting: Gastroenterology

## 2023-01-08 ENCOUNTER — Encounter: Payer: Self-pay | Admitting: Gastroenterology

## 2023-01-08 ENCOUNTER — Telehealth: Payer: Self-pay | Admitting: *Deleted

## 2023-01-08 NOTE — Telephone Encounter (Signed)
Called pt, no answer and not able to leave VM. Need to schedule TCS with Dr. Jena Gauss, ASA 2

## 2023-01-12 LAB — PANCREATIC ELASTASE, FECAL: Pancreatic Elastase, Fecal: 331 ug Elast./g (ref >200)

## 2023-01-14 LAB — COMPREHENSIVE METABOLIC PANEL
ALT: 12 IU/L (ref 0–32)
AST: 20 IU/L (ref 0–40)
Albumin/Globulin Ratio: 1.3 (ref 1.2–2.2)
Albumin: 4 g/dL (ref 3.9–4.9)
Alkaline Phosphatase: 78 IU/L (ref 44–121)
BUN/Creatinine Ratio: 10 — ABNORMAL LOW (ref 12–28)
BUN: 11 mg/dL (ref 8–27)
Bilirubin Total: 0.5 mg/dL (ref 0.0–1.2)
CO2: 20 mmol/L (ref 20–29)
Calcium: 9.3 mg/dL (ref 8.7–10.3)
Chloride: 106 mmol/L (ref 96–106)
Creatinine, Ser: 1.13 mg/dL — ABNORMAL HIGH (ref 0.57–1.00)
Globulin, Total: 3 g/dL (ref 1.5–4.5)
Glucose: 98 mg/dL (ref 70–99)
Potassium: 5 mmol/L (ref 3.5–5.2)
Sodium: 142 mmol/L (ref 134–144)
Total Protein: 7 g/dL (ref 6.0–8.5)
eGFR: 53 mL/min/{1.73_m2} — ABNORMAL LOW (ref >59)

## 2023-01-14 LAB — CBC
Hematocrit: 41.4 % (ref 34.0–46.6)
Hemoglobin: 13 g/dL (ref 11.1–15.9)
MCH: 26.5 pg — ABNORMAL LOW (ref 26.6–33.0)
MCHC: 31.4 g/dL — ABNORMAL LOW (ref 31.5–35.7)
MCV: 85 fL (ref 79–97)
Platelets: 236 10*3/uL (ref 150–450)
RBC: 4.9 x10E6/uL (ref 3.77–5.28)
RDW: 14.1 % (ref 11.7–15.4)
WBC: 3.5 10*3/uL (ref 3.4–10.8)

## 2023-01-14 LAB — ALPHA-GAL PANEL
Allergen Lamb IgE: <0.1 kU/L
Beef IgE: <0.1 kU/L
IgE (Immunoglobulin E), Serum: 63 IU/mL (ref 6–495)
O215-IgE Alpha-Gal: <0.1 kU/L
Pork IgE: <0.1 kU/L

## 2023-01-14 LAB — TSH+FREE T4
Free T4: 1.1 ng/dL (ref 0.82–1.77)
TSH: 0.251 u[IU]/mL — ABNORMAL LOW (ref 0.450–4.500)

## 2023-01-15 NOTE — Telephone Encounter (Signed)
Called pt, no answer and not able to leave VM. Letter mailed 

## 2023-01-16 ENCOUNTER — Encounter: Payer: Self-pay | Admitting: *Deleted

## 2023-02-28 ENCOUNTER — Encounter: Payer: Self-pay | Admitting: Gastroenterology

## 2023-03-28 ENCOUNTER — Other Ambulatory Visit (HOSPITAL_COMMUNITY): Payer: Self-pay | Admitting: Family Medicine

## 2023-03-28 DIAGNOSIS — Z1231 Encounter for screening mammogram for malignant neoplasm of breast: Secondary | ICD-10-CM

## 2023-04-08 ENCOUNTER — Encounter: Payer: Self-pay | Admitting: Gastroenterology

## 2023-04-08 ENCOUNTER — Ambulatory Visit: Payer: Medicare PPO | Admitting: Gastroenterology

## 2023-04-08 VITALS — BP 134/84 | HR 62 | Temp 97.9°F | Ht 60.0 in | Wt 164.6 lb

## 2023-04-08 DIAGNOSIS — Z1211 Encounter for screening for malignant neoplasm of colon: Secondary | ICD-10-CM

## 2023-04-08 DIAGNOSIS — K219 Gastro-esophageal reflux disease without esophagitis: Secondary | ICD-10-CM | POA: Diagnosis not present

## 2023-04-08 DIAGNOSIS — R195 Other fecal abnormalities: Secondary | ICD-10-CM | POA: Diagnosis not present

## 2023-04-08 DIAGNOSIS — R634 Abnormal weight loss: Secondary | ICD-10-CM | POA: Diagnosis not present

## 2023-04-08 MED ORDER — OMEPRAZOLE 20 MG PO CPDR: 20.0000 mg | DELAYED_RELEASE_CAPSULE | Freq: Every day | ORAL | 3 refills | Status: DC

## 2023-04-08 MED ORDER — LOPERAMIDE HCL 2 MG PO TABS: 1.0000 mg | ORAL_TABLET | Freq: Every day | ORAL | 1 refills | Status: AC | PRN

## 2023-04-08 NOTE — Progress Notes (Signed)
GI Office Note    Referring Provider: Karl Bales, NP Primary Care Physician:  Karl Bales, NP Primary Gastroenterologist: Gerrit Friends.Rourk, MD  Date:  04/08/2023  ID:  Rachel Schaefer, DOB October 12, 1955, MRN [id number]   Chief Complaint   Chief Complaint  Patient presents with   Follow-up    Follow up. No problem    History of Present Illness  Rachel Schaefer is a 67 y.o. female with a history of GERD presenting today for follow up of diarrhea.   PCP visit 11/23/22. Noted to have frequent loose stools for 1 month without abdominal pain.  Reported gassiness well.  Reported no prior colonoscopy.  Stool described as a brown bowel movement.  Denied family history of GI disease. Was to obtain stool studies and advised trial of imodium.     Stool studies 11/28/22: GI pathogen panel negative.  Results were O&P.  Lab notes from PCP indicate no evidence of celiac disease.  On follow-up patient reported symptom improvement with use Imodium.    Last office visit 01/07/23. Reported recent increase in looser stools. Also with audible bowel sounds and intermittent abdominal pain in the mornings. Stools were occurring after meals.  He reports some black stools or taking Kaopectate.aWas having urgency as well and also kign omeprazole to avoid nausea and vomiting. Reflux controlled with 20 mg once daily. States a 10 lb weight loss in 3 months. Caring for her mother likely affecting appetite.   Labs 4/22 and 4/23. Pancreatic elastase normal. Alpha gal panel negative. CBC, CMP overall unremarkable. TSH low but T4 normal.   Patient unable to be reached to schedule colonoscopy.   Today:  Loose stools occurring once per week and not as urgent. Was cutting back on imodium and taking it about once daily now. Denies any abdominal pain. She has changed up her diet sometimes as well. When she started back eating and that has seemed to get better. Is staying hydrated.   Still caring for her mother during the day  on the week days and stays with her at nights on the weekends.   Reflux well controlled with omeprazole. No N/V, dysphagia. If she misses a day she will vomit.   Wt Readings from Last 3 Encounters:  04/08/23 164 lb 9.6 oz (74.7 kg)  01/07/23 172 lb 3.2 oz (78.1 kg)  12/21/14 194 lb (88 kg)    Current Outpatient Medications  Medication Sig Dispense Refill   loperamide (IMODIUM) 2 MG capsule 2 capsules (4mg ) for first dose, then1 tablet (2mg ) after each loose stool until stool firm. Stop if develop constipation. Max: 16 mg/day Orally Four times a day for 30 days     Omeprazole (PRILOSEC PO) Take 20 mg by mouth daily.     No current facility-administered medications for this visit.    Past Medical History:  Diagnosis Date   Acid reflux    Hernia, hiatal     Past Surgical History:  Procedure Laterality Date   CESAREAN SECTION     HYSTEROSCOPY WITH D & C N/A 12/14/2014   Procedure: DILATATION AND CURETTAGE /HYSTEROSCOPY;  Surgeon: Tilda Burrow, MD;  Location: [facility];  Service: Gynecology;  Laterality: N/A;   POLYPECTOMY N/A 12/14/2014   Procedure: POLYPECTOMY;  Surgeon: Tilda Burrow, MD;  Location: [facility];  Service: Gynecology;  Laterality: N/A;    Family History  Problem Relation Age of Onset   Diabetes Mother        type 2   Hypertension  Mother    Hyperlipidemia Mother    Glaucoma Mother    Hypertension Sister    Diabetes Brother    Hypertension Brother    Heart attack Brother    Heart disease Brother    Stroke Maternal Grandmother    Cancer Maternal Aunt        breast   Pancreatic disease Neg Hx    Gallbladder disease Neg Hx    Liver disease Neg Hx    Cancer - Colon Neg Hx     Allergies as of 04/08/2023   (No Known Allergies)    Social History   Socioeconomic History   Marital status: Married    Spouse name: Not on file   Number of children: Not on file   Years of education: Not on file   Highest education level: Not on file  Occupational History    Not on file  Tobacco Use   Smoking status: Never   Smokeless tobacco: Never  Substance and Sexual Activity   Alcohol use: No   Drug use: No   Sexual activity: Not Currently    Birth control/protection: Post-menopausal  Other Topics Concern   Not on file  Social History Narrative   Not on file   Social Determinants of Health   Financial Resource Strain: Not on file  Food Insecurity: Not on file  Transportation Needs: Not on file  Physical Activity: Not on file  Stress: Not on file  Social Connections: Not on file     Review of Systems   Gen: Denies fever, chills, anorexia. Denies fatigue, weakness, weight loss.  CV: Denies chest pain, palpitations, syncope, peripheral edema, and claudication. Resp: Denies dyspnea at rest, cough, wheezing, coughing up blood, and pleurisy. GI: See HPI Derm: Denies rash, itching, dry skin Psych: Denies depression, anxiety, memory loss, confusion. No homicidal or suicidal ideation.  Heme: Denies bruising, bleeding, and enlarged lymph nodes.   Physical Exam   BP 134/84 (BP Location: Right Arm, Patient Position: Sitting, Cuff Size: Normal)   Pulse 62   Temp 97.9 F (36.6 C) (Temporal)   Ht 5' (1.524 m)   Wt 164 lb 9.6 oz (74.7 kg)   LMP 12/14/2014 Comment: spotting  SpO2 99%   BMI 32.15 kg/m   General:   Alert and oriented. No distress noted. Pleasant and cooperative.  Head:  Normocephalic and atraumatic. Eyes:  Conjuctiva clear without scleral icterus. Mouth:  Oral mucosa pink and moist. Good dentition. No lesions. Lungs:  Clear to auscultation bilaterally. No wheezes, rales, or rhonchi. No distress.  Heart:  S1, S2 present without murmurs appreciated.  Abdomen:  +BS, soft, non-tender and non-distended. No rebound or guarding. No HSM or masses noted. Rectal: Deferred Msk:  Symmetrical without gross deformities. Normal posture. Extremities:  Without edema. Neurologic:  Alert and  oriented x4 Psych:  Alert and cooperative. Normal  mood and affect.   Assessment  Rachel Schaefer is a 67 y.o. female with a history of GERD presenting today for follow  Loose stools: Previously experiencing postprandial loose stools for 2 months at her last office visit.  Prior workup with negative celiac panel and GI pathogen panel.  Workup with low TSH but normal T4, alpha gal negative, and fecal elastase normal.  CBC and CMP unremarkable.  Given some audible bowel sounds and intermittent urgency and abdominal cramping I suspect IBS given her recent life stress with caring for her mother.  As noted below she is currently due for colonoscopy screening therefore we can  rule out other etiologies as well.  She has had significant improvement of diarrhea as well as urgency with use of Imodium once daily therefore we will continue this.  GERD, weight loss: Well-controlled with omeprazole 20 mg once daily.  Has been obtaining this over-the-counter therefore we will try prescription for this medication given if she goes without it she will vomit. She just recently has had an increased appetite but the stress of caring for her mother has affected her appetite greatly. She has lost 8 lbs since her last office visit in April. Labs have been reassuring.   Screening for colon cancer: Patient previously reported history of polyp removal about 5 years ago her prior performed at [facility] however records indicate an endometrial polyp removed in 2015/2016.  No colonoscopy records available in chart. She would like to reschedule her colonoscopy for September.   PLAN   Proceed with colonoscopy with propofol by Dr. Jena Gauss in near future: the risks, benefits, and alternatives have been discussed with the patient in detail. The patient states understanding and desires to proceed. ASA 2 No imodium for 3 days prior Increase omeprazole to 40 mg for 5 days prior  Zofran as needed Imodium 1 mg daily as needed Continue omeprazole daily. New prescription given.  Follow up  in 6 months.     Brooke Bonito, MSN, FNP-BC, AGACNP-BC [facility]

## 2023-04-08 NOTE — Patient Instructions (Signed)
I have sent in Imodium as well as omeprazole to the pharmacy for you.  You can take half a tablet (1 mg) of Imodium daily to help control diarrhea.  Continue to take omeprazole 20 mg once daily, 30 minutes prior to breakfast.  We will get you scheduled for colonoscopy in the near future with Dr. Jena Gauss.  He is full for August therefore we were waiting September dates before we call to get you scheduled.  We will plan to follow-up in 6 months, sooner if needed.  It was a pleasure to see you today. I want to create trusting relationships with patients. If you receive a survey regarding your visit,  I greatly appreciate you taking time to fill this out on paper or through your MyChart. I value your feedback.  Brooke Bonito, MSN, FNP-BC, AGACNP-BC [facility]

## 2023-05-01 ENCOUNTER — Telehealth: Payer: Self-pay | Admitting: *Deleted

## 2023-05-01 NOTE — Telephone Encounter (Signed)
Called pt, no answer and not able to leave VM. Need to schedule TCS with Dr. Jena Gauss, ASA 2    No imodium x 3 days prior, zofran as needed (will need prescription), increase omperazole to 40mg  daily for 5 days prior

## 2023-05-15 ENCOUNTER — Ambulatory Visit (HOSPITAL_COMMUNITY)
Admission: RE | Admit: 2023-05-15 | Discharge: 2023-05-15 | Disposition: A | Discharge status: Home / Self Care | Payer: Medicare PPO | Source: Ambulatory Visit | Attending: Family Medicine | Admitting: Family Medicine

## 2023-05-15 DIAGNOSIS — Z1231 Encounter for screening mammogram for malignant neoplasm of breast: Secondary | ICD-10-CM | POA: Diagnosis present

## 2023-05-16 ENCOUNTER — Other Ambulatory Visit: Payer: Self-pay | Admitting: Gastroenterology

## 2023-05-16 MED ORDER — PEG 3350-KCL-NA BICARB-NACL 420 G PO SOLR: 4000.0000 mL | Freq: Once | ORAL | 0 refills | Status: AC

## 2023-05-16 MED ORDER — ONDANSETRON 4 MG PO TBDP: 4.0000 mg | ORAL_TABLET | Freq: Three times a day (TID) | ORAL | 0 refills | Status: DC | PRN

## 2023-05-16 NOTE — Addendum Note (Signed)
Addended by: Armstead Peaks on: 05/16/2023 09:35 AM   Modules accepted: Orders

## 2023-05-16 NOTE — Telephone Encounter (Signed)
Pt aware.

## 2023-05-16 NOTE — Telephone Encounter (Signed)
Courtney, please advise regarding zofran rx for pt to send in per encounter form

## 2023-05-16 NOTE — Telephone Encounter (Signed)
Spoke with pt. Scheduled for 9/30. Aware of instructions. Will mail. Rx sent to pharmacy.  PA approved. Authorization #[id number], DOS: 06/17/2023 - 08/17/2023

## 2023-06-17 ENCOUNTER — Encounter (HOSPITAL_COMMUNITY)
Admission: RE | Disposition: A | Discharge status: Home / Self Care | Payer: Self-pay | Source: Ambulatory Visit | Attending: Internal Medicine

## 2023-06-17 ENCOUNTER — Ambulatory Visit (HOSPITAL_COMMUNITY)
Admission: RE | Admit: 2023-06-17 | Discharge: 2023-06-17 | Disposition: A | Discharge status: Home / Self Care | Payer: Medicare PPO | Source: Ambulatory Visit | Attending: Internal Medicine | Admitting: Internal Medicine

## 2023-06-17 ENCOUNTER — Encounter (HOSPITAL_COMMUNITY): Payer: Self-pay | Admitting: Internal Medicine

## 2023-06-17 ENCOUNTER — Ambulatory Visit (HOSPITAL_COMMUNITY): Payer: Medicare PPO | Admitting: Anesthesiology

## 2023-06-17 ENCOUNTER — Other Ambulatory Visit: Payer: Self-pay

## 2023-06-17 DIAGNOSIS — K5730 Diverticulosis of large intestine without perforation or abscess without bleeding: Secondary | ICD-10-CM | POA: Insufficient documentation

## 2023-06-17 DIAGNOSIS — K219 Gastro-esophageal reflux disease without esophagitis: Secondary | ICD-10-CM | POA: Diagnosis not present

## 2023-06-17 DIAGNOSIS — K529 Noninfective gastroenteritis and colitis, unspecified: Secondary | ICD-10-CM | POA: Insufficient documentation

## 2023-06-17 DIAGNOSIS — Z79899 Other long term (current) drug therapy: Secondary | ICD-10-CM | POA: Diagnosis not present

## 2023-06-17 DIAGNOSIS — K640 First degree hemorrhoids: Secondary | ICD-10-CM | POA: Diagnosis not present

## 2023-06-17 HISTORY — PX: COLONOSCOPY WITH PROPOFOL: SHX5780

## 2023-06-17 HISTORY — PX: POLYPECTOMY: SHX5525

## 2023-06-17 SURGERY — COLONOSCOPY WITH PROPOFOL
Anesthesia: General

## 2023-06-17 MED ORDER — LACTATED RINGERS IV SOLN: INTRAVENOUS | Status: DC

## 2023-06-17 MED ORDER — PROPOFOL 10 MG/ML IV BOLUS
INTRAVENOUS | Status: DC | PRN
  Administered 2023-06-17: 100 mg via INTRAVENOUS

## 2023-06-17 MED ORDER — PROPOFOL 500 MG/50ML IV EMUL
INTRAVENOUS | Status: DC | PRN
  Administered 2023-06-17: 200 ug/kg/min via INTRAVENOUS

## 2023-06-17 NOTE — Discharge Instructions (Addendum)
  Colonoscopy Discharge Instructions  Read the instructions outlined below and refer to this sheet in the next few weeks. These discharge instructions provide you with general information on caring for yourself after you leave the hospital. Your doctor may also give you specific instructions. While your treatment has been planned according to the most current medical practices available, unavoidable complications occasionally occur. If you have any problems or questions after discharge, call Dr. Jena Gauss at [phone]. ACTIVITY You may resume your regular activity, but move at a slower pace for the next 24 hours.  Take frequent rest periods for the next 24 hours.  Walking will help get rid of the air and reduce the bloated feeling in your belly (abdomen).  No driving for 24 hours (because of the medicine (anesthesia) used during the test).   Do not sign any important legal documents or operate any machinery for 24 hours (because of the anesthesia used during the test).  NUTRITION Drink plenty of fluids.  You may resume your normal diet as instructed by your doctor.  Begin with a light meal and progress to your normal diet. Heavy or fried foods are harder to digest and may make you feel sick to your stomach (nauseated).  Avoid alcoholic beverages for 24 hours or as instructed.  MEDICATIONS You may resume your normal medications unless your doctor tells you otherwise.  WHAT YOU CAN EXPECT TODAY Some feelings of bloating in the abdomen.  Passage of more gas than usual.  Spotting of blood in your stool or on the toilet paper.  IF YOU HAD POLYPS REMOVED DURING THE COLONOSCOPY: No aspirin products for 7 days or as instructed.  No alcohol for 7 days or as instructed.  Eat a soft diet for the next 24 hours.  FINDING OUT THE RESULTS OF YOUR TEST Not all test results are available during your visit. If your test results are not back during the visit, make an appointment with your caregiver to find out the  results. Do not assume everything is normal if you have not heard from your caregiver or the medical facility. It is important for you to follow up on all of your test results.  SEEK IMMEDIATE MEDICAL ATTENTION IF: You have more than a spotting of blood in your stool.  Your belly is swollen (abdominal distention).  You are nauseated or vomiting.  You have a temperature over 101.  You have abdominal pain or discomfort that is severe or gets worse throughout the day.    No polyps.  Biopsies taken to evaluate for loose stools  Further recommendations to follow pending results of biopsy report  Office visit with [place] in 3 months OFFICE TO CALL WITH APPOINTMENT  At patient request, I called him at [phone];  reviewed findings and recommendations.

## 2023-06-17 NOTE — Transfer of Care (Signed)
Immediate Anesthesia Transfer of Care Note  Patient: Rachel Schaefer  Procedure(s) Performed: COLONOSCOPY WITH PROPOFOL POLYPECTOMY  Patient Location: Short Stay  Anesthesia Type:General  Level of Consciousness: awake, alert , oriented, and patient cooperative  Airway & Oxygen Therapy: Patient Spontanous Breathing  Post-op Assessment: Report given to RN, Post -op Vital signs reviewed and stable, and Patient moving all extremities X 4  Post vital signs: Reviewed and stable  Last Vitals:  Vitals Value Taken Time  BP    Temp    Pulse    Resp    SpO2      Last Pain:  Vitals:   06/17/23 0728  TempSrc: Oral  PainSc:       Patients Stated Pain Goal: 1 (06/17/23 0711)  Complications: No notable events documented.

## 2023-06-17 NOTE — H&P (Signed)
@LOGO @   Primary Care Physician:  Karl Bales, NP Primary Gastroenterologist:  Dr. Jena Gauss  Pre-Procedure History & Physical: HPI:  Rachel Schaefer is a 67 y.o. female here for  colonoscopy.  Chronic diarrhea. States she had her first colonoscopy in [place] at age 69 without significant findings that she recalls.  Past Medical History:  Diagnosis Date   Acid reflux    Hernia, hiatal     Past Surgical History:  Procedure Laterality Date   CESAREAN SECTION     HYSTEROSCOPY WITH D & C N/A 12/14/2014   Procedure: DILATATION AND CURETTAGE /HYSTEROSCOPY;  Surgeon: Tilda Burrow, MD;  Location: [facility];  Service: Gynecology;  Laterality: N/A;   POLYPECTOMY N/A 12/14/2014   Procedure: POLYPECTOMY;  Surgeon: Tilda Burrow, MD;  Location: [facility];  Service: Gynecology;  Laterality: N/A;    Prior to Admission medications   Medication Sig Start Date End Date Taking? Authorizing Provider  loperamide (IMODIUM A-D) 2 MG tablet Take 0.5 tablets (1 mg total) by mouth daily as needed for diarrhea or loose stools. 04/08/23  Yes Aida Raider, NP  Multiple Vitamin (MULTIVITAMIN) tablet Take 1 tablet by mouth daily.   Yes [provider]  omeprazole (PRILOSEC) 20 MG capsule Take 1 capsule (20 mg total) by mouth daily. 04/08/23  Yes Mahon, Courtney L, NP  ondansetron (ZOFRAN-ODT) 4 MG disintegrating tablet Take 1 tablet (4 mg total) by mouth every 8 (eight) hours as needed for nausea or vomiting. Take as needed on day of colonoscopy prep. 05/16/23   Aida Raider, NP    Allergies as of 05/16/2023   (No Known Allergies)    Family History  Problem Relation Age of Onset   Diabetes Mother        type 2   Hypertension Mother    Hyperlipidemia Mother    Glaucoma Mother    Hypertension Sister    Diabetes Brother    Hypertension Brother    Heart attack Brother    Heart disease Brother    Stroke Maternal Grandmother    Cancer Maternal Aunt        breast   Pancreatic disease Neg Hx     Gallbladder disease Neg Hx    Liver disease Neg Hx    Cancer - Colon Neg Hx     Social History   Socioeconomic History   Marital status: Married    Spouse name: Not on file   Number of children: Not on file   Years of education: Not on file   Highest education level: Not on file  Occupational History   Not on file  Tobacco Use   Smoking status: Never   Smokeless tobacco: Never  Vaping Use   Vaping status: Never Used  Substance and Sexual Activity   Alcohol use: No   Drug use: No   Sexual activity: Not Currently    Birth control/protection: Post-menopausal  Other Topics Concern   Not on file  Social History Narrative   Not on file   Social Determinants of Health   Financial Resource Strain: Not on file  Food Insecurity: Not on file  Transportation Needs: Not on file  Physical Activity: Not on file  Stress: Not on file  Social Connections: Not on file  Intimate Partner Violence: Not on file    Review of Systems: See HPI, otherwise negative ROS  Physical Exam: BP 133/71   Pulse (!) 56   Temp 97.8 F (36.6 C) (Oral)  Resp (!) 21   Ht 5\' 1"  (1.549 m)   Wt 68 kg   LMP 12/14/2014 Comment: spotting  SpO2 100%   BMI 28.34 kg/m  General:   Alert,  Well-developed, well-nourished, pleasant and cooperative in NAD Lungs:  Clear throughout to auscultation.   No wheezes, crackles, or rhonchi. No acute distress. Heart:  Regular rate and rhythm; no murmurs, clicks, rubs,  or gallops. Abdomen: Non-distended, normal bowel sounds.  Soft and nontender without appreciable mass or hepatosplenomegaly.   Impression/Plan: 67 year old lady with chronic diarrhea.  Here for a diagnostic colonoscopy the risks, benefits, limitations, alternatives and imponderables have been reviewed with the patient. Questions have been answered. All parties are agreeable.       Notice: This dictation was prepared with Dragon dictation along with smaller phrase technology. Any transcriptional  errors that result from this process are unintentional and may not be corrected upon review.

## 2023-06-17 NOTE — Anesthesia Preprocedure Evaluation (Signed)
Anesthesia Evaluation  Patient identified by MRN, date of birth, ID band Patient awake    Reviewed: Allergy & Precautions, H&P , NPO status , Patient's Chart, lab work & pertinent test results, reviewed documented beta blocker date and time   Airway Mallampati: II  TM Distance: >3 FB Neck ROM: full    Dental no notable dental hx.    Pulmonary neg pulmonary ROS   Pulmonary exam normal breath sounds clear to auscultation       Cardiovascular Exercise Tolerance: Good negative cardio ROS  Rhythm:regular Rate:Normal     Neuro/Psych  Neuromuscular disease negative neurological ROS  negative psych ROS   GI/Hepatic negative GI ROS, Neg liver ROS, hiatal hernia,GERD  ,,  Endo/Other  negative endocrine ROS    Renal/GU negative Renal ROS  negative genitourinary   Musculoskeletal   Abdominal   Peds  Hematology negative hematology ROS (+)   Anesthesia Other Findings   Reproductive/Obstetrics negative OB ROS                             Anesthesia Physical Anesthesia Plan  ASA: 2  Anesthesia Plan: General   Post-op Pain Management:    Induction:   PONV Risk Score and Plan: Propofol infusion  Airway Management Planned:   Additional Equipment:   Intra-op Plan:   Post-operative Plan:   Informed Consent: I have reviewed the patients History and Physical, chart, labs and discussed the procedure including the risks, benefits and alternatives for the proposed anesthesia with the patient or authorized representative who has indicated his/her understanding and acceptance.     Dental Advisory Given  Plan Discussed with: CRNA  Anesthesia Plan Comments:        Anesthesia Quick Evaluation

## 2023-06-17 NOTE — Anesthesia Postprocedure Evaluation (Signed)
Anesthesia Post Note  Patient: Rachel Schaefer  Procedure(s) Performed: COLONOSCOPY WITH PROPOFOL POLYPECTOMY  Patient location during evaluation: Phase II Anesthesia Type: General Level of consciousness: awake Pain management: pain level controlled Vital Signs Assessment: post-procedure vital signs reviewed and stable Respiratory status: spontaneous breathing and respiratory function stable Cardiovascular status: blood pressure returned to baseline and stable Postop Assessment: no headache and no apparent nausea or vomiting Anesthetic complications: no Comments: Late entry   No notable events documented.   Last Vitals:  Vitals:   06/17/23 0728 06/17/23 0850  BP: 133/71 (!) 93/47  Pulse: (!) 56 63  Resp: (!) 21 20  Temp: 36.6 C 36.4 C  SpO2: 100% 99%    Last Pain:  Vitals:   06/17/23 0853  TempSrc:   PainSc: 0-No pain                 Windell Norfolk

## 2023-06-17 NOTE — Op Note (Signed)
[facility] Patient Name: Rachel Schaefer Procedure Date: 06/17/2023 8:02 AM MRN: [id number] Date of Birth: 12/06/1955 Attending MD: Gennette Pac , MD, [id number] CSN: [id number] Age: 67 Admit Type: Outpatient Procedure:                Colonoscopy Indications:              Chronic diarrhea Providers:                Gennette Pac, MD, Nena Polio, RN, Lafonda Mosses, Technician Referring MD:              Medicines:                Propofol per Anesthesia Complications:            No immediate complications. Estimated Blood Loss:     Estimated blood loss was minimal. Procedure:                Pre-Anesthesia Assessment:                           - Prior to the procedure, a History and Physical                            was performed, and patient medications and                            allergies were reviewed. The patient's tolerance of                            previous anesthesia was also reviewed. The risks                            and benefits of the procedure and the sedation                            options and risks were discussed with the patient.                            All questions were answered, and informed consent                            was obtained. Prior Anticoagulants: The patient has                            taken no anticoagulant or antiplatelet agents. ASA                            Grade Assessment: II - A patient with mild systemic                            disease. After reviewing the risks and benefits,  the patient was deemed in satisfactory condition to                            undergo the procedure.                           After obtaining informed consent, the colonoscope                            was passed under direct vision. Throughout the                            procedure, the patient's blood pressure, pulse, and                            oxygen  saturations were monitored continuously. The                            [id number]) scope was introduced through the                            anus and advanced to the the cecum, identified by                            appendiceal orifice and ileocecal valve. The                            colonoscopy was performed without difficulty. The                            patient tolerated the procedure well. The quality                            of the bowel preparation was adequate. The terminal                            ileum, ileocecal valve, appendiceal orifice, and                            rectum were photographed. Scope In: 8:32:28 AM Scope Out: 8:45:10 AM Scope Withdrawal Time: 0 hours 8 minutes 50 seconds  Total Procedure Duration: 0 hours 12 minutes 42 seconds  Findings:      The perianal and digital rectal examinations were normal.      Non-bleeding internal hemorrhoids were found during retroflexion. The       hemorrhoids were mild, small and Grade I (internal hemorrhoids that do       not prolapse).      A single medium-mouthed diverticulum was found in the descending colon.       Distal 5 cm of TI appeared normal. Segmental biopsies of the left and       right colon taken      The exam was otherwise without abnormality on direct and retroflexion       views. Impression:               -  Non-bleeding internal hemorrhoids.                           - Diverticulosis in the descending colon.                           - The examination was otherwise normal on direct                            and retroflexion views.                           -Segmental right and left colon biopsies taken.                            Normal-appearing terminal ileum. Moderate Sedation:      Moderate (conscious) sedation was personally administered by an       anesthesia professional. The following parameters were monitored: oxygen       saturation, heart rate, blood pressure, and response  to care. Recommendation:           - Patient has a contact number available for                            emergencies. The signs and symptoms of potential                            delayed complications were discussed with the                            patient. Return to normal activities tomorrow.                            Written discharge instructions were provided to the                            patient.                           - Advance diet as tolerated.                           - Continue present medications.                           - Repeat colonoscopy in 10 years for screening                            purposes.                           - Return to GI office in 3 months. Procedure Code(s):        --- Professional ---                           [phone], Colonoscopy, flexible; diagnostic, including  collection of specimen(s) by brushing or washing,                            when performed (separate procedure) Diagnosis Code(s):        --- Professional ---                           K64.0, First degree hemorrhoids                           K52.9, Noninfective gastroenteritis and colitis,                            unspecified                           K57.30, Diverticulosis of large intestine without                            perforation or abscess without bleeding CPT copyright 2022 American Medical Association. All rights reserved. The codes documented in this report are preliminary and upon coder review may  be revised to meet current compliance requirements. Gerrit Friends. Malyk Girouard, MD Gennette Pac, MD 06/17/2023 8:57:46 AM This report has been signed electronically. Number of Addenda: 0

## 2023-06-19 LAB — SURGICAL PATHOLOGY

## 2023-06-24 ENCOUNTER — Encounter (HOSPITAL_COMMUNITY): Payer: Self-pay | Admitting: Internal Medicine

## 2023-06-24 ENCOUNTER — Encounter: Payer: Self-pay | Admitting: Internal Medicine

## 2023-09-06 ENCOUNTER — Encounter: Payer: Self-pay | Admitting: Gastroenterology

## 2024-02-12 NOTE — Progress Notes (Unsigned)
 GI Office Note    Referring Provider: Ezell Hollow, NP Primary Care Physician:  Ezell Hollow, NP Primary Gastroenterologist: Windsor Hatcher.Rourk, MD  Date:  02/13/2024  ID:  Rachel Schaefer, DOB 09-24-1955, MRN [id number]  Chief Complaint   Chief Complaint  Patient presents with   Follow-up   History of Present Illness  Rachel Schaefer is a 68 y.o. female with a history of GERD presenting today for follow up.  Stool studies 11/28/22: GI pathogen panel negative.  Results were O&P.  Lab notes from PCP indicate no evidence of celiac disease.  On follow-up patient reported symptom improvement with use Imodium .     OV 01/07/23. Reported recent increase in looser stools. Also with audible bowel sounds and intermittent abdominal pain in the mornings. Stools were occurring after meals.  He reports some black stools or taking Kaopectate.aWas having urgency as well and also kign omeprazole  to avoid nausea and vomiting. Reflux controlled with 20 mg once daily. States a 10 lb weight loss in 3 months. Caring for her mother likely affecting appetite.    Labs 4/22 and 4/23. Pancreatic elastase normal. Alpha gal panel negative. CBC, CMP overall unremarkable. TSH low but T4 normal.    Last office visit 04/08/23.  Loose stools once weekly and not as urgent. Imodium  once daily. Denies abdominal pain. Reflux controlled. If she misses a dose she has vomiting.   Colonoscopy 06/17/23: - Non- bleeding internal hemorrhoids.  - Diverticulosis in the descending colon.  - The examination was otherwise normal on direct and retroflexion views.  - Segmental right and left colon biopsies taken.  - Normal- appearing terminal ileum. - Repeat in 10 years.   Today:  For the last 2 weeks she has been having knee and leg and has been taking tylenol to help. Has discomfort in her back and belly.   Only going once daily in the morning - sometimes formed but if she eats risen bran she will have some looser stools. Does drink  coffee every morning. No abdominal pain except with movement. No melena or brbpr. Takes the imodium  as needed, sometimes skips a day.   Appetite is good, coming back. Has gained weight since last visit and eating much better. Still caring for her mother.  Denies nausea, vomiting, dysphagia.  Taking her omeprazole  20 mg once daily - if she doesn't then symptoms come back severe.   Has never had a bone density scan. Has had issue with bone loss and had to have teeth removed.    Wt Readings from Last 3 Encounters:  02/13/24 171 lb 3.2 oz (77.7 kg)  06/17/23 150 lb (68 kg)  04/08/23 164 lb 9.6 oz (74.7 kg)    Current Outpatient Medications  Medication Sig Dispense Refill   loperamide  (IMODIUM  A-D) 2 MG tablet Take 0.5 tablets (1 mg total) by mouth daily as needed for diarrhea or loose stools. 30 tablet 1   Multiple Vitamin (MULTIVITAMIN) tablet Take 1 tablet by mouth daily.     omeprazole  (PRILOSEC) 20 MG capsule Take 1 capsule (20 mg total) by mouth daily. 30 capsule 3   No current facility-administered medications for this visit.    Past Medical History:  Diagnosis Date   Acid reflux    Hernia, hiatal     Past Surgical History:  Procedure Laterality Date   CESAREAN SECTION     COLONOSCOPY WITH PROPOFOL  N/A 06/17/2023   Procedure: COLONOSCOPY WITH PROPOFOL ;  Surgeon: Suzette Espy, MD;  Location: [facility];  Service: Endoscopy;  Laterality: N/A;  815am, asa 2   HYSTEROSCOPY WITH D & C N/A 12/14/2014   Procedure: DILATATION AND CURETTAGE /HYSTEROSCOPY;  Surgeon: Albino Hum, MD;  Location: [facility];  Service: Gynecology;  Laterality: N/A;   POLYPECTOMY N/A 12/14/2014   Procedure: POLYPECTOMY;  Surgeon: Albino Hum, MD;  Location: [facility];  Service: Gynecology;  Laterality: N/A;   POLYPECTOMY  06/17/2023   Procedure: POLYPECTOMY;  Surgeon: Suzette Espy, MD;  Location: [facility];  Service: Endoscopy;;    Family History  Problem Relation Age of Onset   Diabetes  Mother        type 2   Hypertension Mother    Hyperlipidemia Mother    Glaucoma Mother    Hypertension Sister    Diabetes Brother    Hypertension Brother    Heart attack Brother    Heart disease Brother    Stroke Maternal Grandmother    Cancer Maternal Aunt        breast   Pancreatic disease Neg Hx    Gallbladder disease Neg Hx    Liver disease Neg Hx    Cancer - Colon Neg Hx     Allergies as of 02/13/2024   (No Known Allergies)    Social History   Socioeconomic History   Marital status: Married    Spouse name: Not on file   Number of children: Not on file   Years of education: Not on file   Highest education level: Not on file  Occupational History   Not on file  Tobacco Use   Smoking status: Never   Smokeless tobacco: Never  Vaping Use   Vaping status: Never Used  Substance and Sexual Activity   Alcohol use: No   Drug use: No   Sexual activity: Not Currently    Birth control/protection: Post-menopausal  Other Topics Concern   Not on file  Social History Narrative   Not on file   Social Drivers of Health   Financial Resource Strain: Not on file  Food Insecurity: Not on file  Transportation Needs: Not on file  Physical Activity: Not on file  Stress: Not on file  Social Connections: Not on file   Review of Systems   Gen: Denies fever, chills, anorexia. Denies fatigue, weakness, weight loss.  CV: Denies chest pain, palpitations, syncope, peripheral edema, and claudication. Resp: Denies dyspnea at rest, cough, wheezing, coughing up blood, and pleurisy. GI: See HPI Derm: Denies rash, itching, dry skin Psych: Denies depression, anxiety, memory loss, confusion. No homicidal or suicidal ideation.  Heme: Denies bruising, bleeding, and enlarged lymph nodes.  Physical Exam   BP (!) 161/91 (BP Location: Right Arm, Patient Position: Sitting, Cuff Size: Normal)   Pulse (!) 56   Temp 98.1 F (36.7 C) (Oral)   Ht 5' (1.524 m)   Wt 171 lb 3.2 oz (77.7 kg)    LMP 12/14/2014 Comment: spotting  SpO2 100%   BMI 33.44 kg/m   General:   Alert and oriented. No distress noted. Pleasant and cooperative.  Head:  Normocephalic and atraumatic. Eyes:  Conjuctiva clear without scleral icterus. Mouth:  Oral mucosa pink and moist. Good dentition. No lesions. Abdomen:  +BS, soft, non-tender and non-distended. No rebound or guarding. No HSM or masses noted. Rectal: deferred Msk:  Symmetrical without gross deformities. Normal posture. Extremities:  Without edema. Neurologic:  Alert and  oriented x4 Psych:  Alert and cooperative. Normal mood and affect.  Assessment  Rachel Schaefer is a 68 y.o. female with a history of GERD and chronic loose stools presenting today for annual follow up.   GERD: Appetite has improved as well as weight has increased since last visit.  Symptoms well-controlled with omeprazole  20 mg once daily.  If she does miss doses she may have some nausea and/or vomiting.  Denies any dysphagia or odynophagia.  Will continue current regimen.  Has not had a bone density scan.  Given her age and long-term use of PPI we will order this today.  For now recommended vitamin D3 1000 units daily.  Loose Stools: Symptoms currently well-controlled with Imodium  1 mg daily as needed.  Does not take every day however may skip a day.  Will have some loose stools if she eats raisin bran however with her current diet she is having mostly formed stools.  Prior workup with negative stool studies, celiac panel, normal thyroid function, negative alpha gal, and normal fecal elastase.  Symptoms could be bile acid versus IBS given she has in the past had some abdominal pain and increased stress with caring for her mother.  Given her current regimen works we will continue Imodium  1 mg daily as needed.  PLAN   DEXA scan Continue omeprazole  20 mg once daily. Refilled. Imodium  daily as needed Low fiber diet Encouraged to discuss back and knee pain with PCP Recommended 1000  units vitamin D3 daily. Follow up in 1 year.    Julian Obey, MSN, FNP-BC, AGACNP-BC [facility]

## 2024-02-13 ENCOUNTER — Other Ambulatory Visit: Payer: Self-pay | Admitting: *Deleted

## 2024-02-13 ENCOUNTER — Ambulatory Visit: Admitting: Gastroenterology

## 2024-02-13 ENCOUNTER — Telehealth: Payer: Self-pay | Admitting: *Deleted

## 2024-02-13 ENCOUNTER — Encounter: Payer: Self-pay | Admitting: Gastroenterology

## 2024-02-13 VITALS — BP 161/91 | HR 56 | Temp 98.1°F | Ht 60.0 in | Wt 171.2 lb

## 2024-02-13 DIAGNOSIS — R195 Other fecal abnormalities: Secondary | ICD-10-CM | POA: Diagnosis not present

## 2024-02-13 DIAGNOSIS — K219 Gastro-esophageal reflux disease without esophagitis: Secondary | ICD-10-CM | POA: Diagnosis not present

## 2024-02-13 DIAGNOSIS — Z13820 Encounter for screening for osteoporosis: Secondary | ICD-10-CM

## 2024-02-13 DIAGNOSIS — Z79899 Other long term (current) drug therapy: Secondary | ICD-10-CM

## 2024-02-13 MED ORDER — OMEPRAZOLE 20 MG PO CPDR: 20.0000 mg | DELAYED_RELEASE_CAPSULE | Freq: Every day | ORAL | 3 refills | Status: DC

## 2024-02-13 NOTE — Telephone Encounter (Signed)
 Per [facility] no PA required  Dexa scheduled for 6/5, arrival 10:45am, no calcium/calcium products 2 days prior  Patient aware of appt details

## 2024-02-13 NOTE — Patient Instructions (Signed)
 I have sent omeprazole  refill to the pharmacy for you.  Please continue to take Imodium  1/2 tablet daily as needed for loose stools.  We will get you scheduled for a bone density scan in the near future.  Will work on getting this either in [place] or [employer] for you.  If you develop any new symptoms or have any worsening diarrhea or reflux please feel free to contact us  otherwise we will plan to follow-up in 1 year.  It was wonderful to see you again today!  I want to create trusting relationships with patients. If you receive a survey regarding your visit,  I greatly appreciate you taking time to fill this out on paper or through your MyChart. I value your feedback.  Julian Obey, MSN, FNP-BC, AGACNP-BC [facility]

## 2024-02-20 ENCOUNTER — Ambulatory Visit (HOSPITAL_COMMUNITY)
Admission: RE | Admit: 2024-02-20 | Discharge: 2024-02-20 | Disposition: A | Discharge status: Home / Self Care | Source: Ambulatory Visit | Attending: Gastroenterology | Admitting: Gastroenterology

## 2024-02-20 ENCOUNTER — Ambulatory Visit: Payer: Self-pay | Admitting: Gastroenterology

## 2024-02-20 DIAGNOSIS — Z79899 Other long term (current) drug therapy: Secondary | ICD-10-CM | POA: Diagnosis present

## 2024-02-20 DIAGNOSIS — M85852 Other specified disorders of bone density and structure, left thigh: Secondary | ICD-10-CM | POA: Diagnosis not present

## 2024-02-20 DIAGNOSIS — Z780 Asymptomatic menopausal state: Secondary | ICD-10-CM | POA: Insufficient documentation

## 2024-02-20 DIAGNOSIS — Z13820 Encounter for screening for osteoporosis: Secondary | ICD-10-CM | POA: Diagnosis present

## 2024-04-07 ENCOUNTER — Other Ambulatory Visit (HOSPITAL_COMMUNITY): Payer: Self-pay | Admitting: Family Medicine

## 2024-04-07 DIAGNOSIS — Z1231 Encounter for screening mammogram for malignant neoplasm of breast: Secondary | ICD-10-CM

## 2024-05-20 ENCOUNTER — Encounter (HOSPITAL_COMMUNITY): Payer: Self-pay

## 2024-05-20 ENCOUNTER — Ambulatory Visit (HOSPITAL_COMMUNITY)
Admission: RE | Admit: 2024-05-20 | Discharge: 2024-05-20 | Disposition: A | Discharge status: Home / Self Care | Source: Ambulatory Visit | Attending: Family Medicine | Admitting: Family Medicine

## 2024-05-20 DIAGNOSIS — Z1231 Encounter for screening mammogram for malignant neoplasm of breast: Secondary | ICD-10-CM | POA: Diagnosis present

## 2024-06-05 ENCOUNTER — Other Ambulatory Visit: Payer: Self-pay | Admitting: Gastroenterology

## 2024-06-05 DIAGNOSIS — K219 Gastro-esophageal reflux disease without esophagitis: Secondary | ICD-10-CM
